# Patient Record
Sex: Female | Born: 1960 | Hispanic: No | Marital: Married | Smoking: Never smoker
Health system: Southern US, Community
[De-identification: ages and names within clinical notes are randomized; demographics above are authoritative.]

## PROBLEM LIST (undated history)

## (undated) DIAGNOSIS — K8020 Calculus of gallbladder without cholecystitis without obstruction: Secondary | ICD-10-CM

## (undated) DIAGNOSIS — Z8669 Personal history of other diseases of the nervous system and sense organs: Secondary | ICD-10-CM

## (undated) DIAGNOSIS — J302 Other seasonal allergic rhinitis: Secondary | ICD-10-CM

## (undated) DIAGNOSIS — E538 Deficiency of other specified B group vitamins: Secondary | ICD-10-CM

## (undated) DIAGNOSIS — M47817 Spondylosis without myelopathy or radiculopathy, lumbosacral region: Secondary | ICD-10-CM

## (undated) DIAGNOSIS — K7581 Nonalcoholic steatohepatitis (NASH): Secondary | ICD-10-CM

## (undated) DIAGNOSIS — K219 Gastro-esophageal reflux disease without esophagitis: Secondary | ICD-10-CM

## (undated) DIAGNOSIS — A630 Anogenital (venereal) warts: Secondary | ICD-10-CM

## (undated) DIAGNOSIS — M5137 Other intervertebral disc degeneration, lumbosacral region: Secondary | ICD-10-CM

## (undated) DIAGNOSIS — K811 Chronic cholecystitis: Secondary | ICD-10-CM

## (undated) DIAGNOSIS — F4321 Adjustment disorder with depressed mood: Secondary | ICD-10-CM

## (undated) DIAGNOSIS — M4180 Other forms of scoliosis, site unspecified: Secondary | ICD-10-CM

## (undated) HISTORY — DX: Spondylosis without myelopathy or radiculopathy, lumbosacral region: M47.817

## (undated) HISTORY — DX: Other forms of scoliosis, site unspecified: M41.80

## (undated) HISTORY — PX: BUNIONECTOMY: SHX129

## (undated) HISTORY — DX: Other intervertebral disc degeneration, lumbosacral region: M51.37

## (undated) HISTORY — DX: Other seasonal allergic rhinitis: J30.2

## (undated) HISTORY — DX: Personal history of other diseases of the nervous system and sense organs: Z86.69

## (undated) HISTORY — DX: Chronic cholecystitis: K81.1

## (undated) HISTORY — DX: Deficiency of other specified B group vitamins: E53.8

## (undated) HISTORY — DX: Adjustment disorder with depressed mood: F43.21

## (undated) HISTORY — DX: Nonalcoholic steatohepatitis (NASH): K75.81

## (undated) HISTORY — DX: Anogenital (venereal) warts: A63.0

## (undated) HISTORY — DX: Gastro-esophageal reflux disease without esophagitis: K21.9

---

## 2010-04-08 DIAGNOSIS — N644 Mastodynia: Secondary | ICD-10-CM

## 2010-04-08 DIAGNOSIS — R079 Chest pain, unspecified: Secondary | ICD-10-CM

## 2010-04-09 ENCOUNTER — Ambulatory Visit: Payer: Self-pay | Admitting: Cardiovascular Disease

## 2010-04-09 ENCOUNTER — Encounter: Payer: Self-pay | Admitting: Cardiovascular Disease

## 2010-04-17 ENCOUNTER — Encounter: Admission: RE | Admit: 2010-04-17 | Discharge: 2010-04-17 | Payer: Self-pay | Admitting: Family Medicine

## 2010-07-09 NOTE — Assessment & Plan Note (Signed)
Summary: NP6/ CHESTPAIN X 4-6 WEEKS. PT IS SELF PAY./ GD   Visit Type:  Initial Consult Primary Provider:  Dr Shanda Bumps Copland  CC:  chest pain.  History of Present Illness: 50 yo Hispanic female with no significant past medical history who is here today as a new patient for further evaluation of left sided chest pain. She was seen in the [facility] by Dr. Abbe Amsterdam on April 05, 2010. EKG normal at that time. She tells me today that she has had a pulling type pain in her left breast, sometimes around lateral left chest wall. This is sometimes reproduced by pressing her chest wall. The pain lasts for 2 seconds to 2 hours. There is no associated SOB, diaphoresis, nausea or vomiting. The pain occurs 1-2 hours after meals. She denies pre-syncope or syncope. The pain has been relieved with Prevacid. She feels a burning in her esophagus just before the onset of the pain.   Preventive Screening-Counseling & Management  Alcohol-Tobacco     Smoking Status: never  Caffeine-Diet-Exercise     Does Patient Exercise: no      Drug Use:  no.    Current Medications (verified): 1)  None  Allergies (verified): No Known Drug Allergies  Past History:  Past Medical History: Gallstones    Past Surgical History: Bilateral foot surgery  Family History: Reviewed history and no changes required. 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  Alcohol Use - no Regular Exercise - no Drug Use - no Originally from [place] Smoking Status:  never Does Patient Exercise:  no Drug Use:  no  Review of Systems       The patient complains of chest pain.  The patient denies fatigue, malaise, fever, weight gain/loss, vision loss, decreased hearing, hoarseness, palpitations, shortness of breath, prolonged cough, wheezing, sleep apnea, coughing up  blood, abdominal pain, blood in stool, nausea, vomiting, diarrhea, heartburn, incontinence, blood in urine, muscle weakness, joint pain, leg swelling, rash, skin lesions, headache, fainting, dizziness, depression, anxiety, enlarged lymph nodes, easy bruising or bleeding, and environmental allergies.    Vital Signs:  Patient profile:   50 year old female Height:      62 inches Weight:      153 pounds BMI:     28.09 Pulse rate:   69 / minute BP sitting:   98 / 60  (left arm) Cuff size:   regular  Vitals Entered By: Hardin Negus, RMA (April 09, 2010 10:27 AM)  Physical Exam  General:  General: Well developed, well nourished, NAD HEENT: OP clear, mucus membranes moist SKIN: warm, dry Neuro: No focal deficits Musculoskeletal: Muscle strength 5/5 all ext Psychiatric: Mood and affect normal Neck: No JVD, no carotid bruits, no thyromegaly, no lymphadenopathy. Lungs:Clear bilaterally, no wheezes, rhonci, crackles CV: RRR no murmurs, gallops rubs Abdomen: soft, NT, ND, BS present Extremities: No edema, pulses 2+.    EKG  Procedure date:  04/09/2010  Findings:      NSR, rate 69 bpm. Premature atrial contraction. No ischemci changes.   Impression & Recommendations:  Problem # 1:  CHEST PAIN (ICD-786.50) Her pain is atypical. Her only risk factor for CAD  is her family history . She does not smoke and has no history of HTN, hyperlipidemia or Diabetes. She is active and not overweight. The pain occurs 1-2 hours after meals and is relieved  with Prevacid.  I have discussed the low likelihood of CAD with the patient and offered an exercise treadmill stress test to rule out ischemia. She is a self pay without insurance and chooses not to pursue the stress test at this time. She will call us if she has any change in her clinical status.   Orders: EKG w/ Interpretation (93000)  Patient Instructions: 1)  Your physician recommends that you schedule a follow-up appointment as needed. 2)   Your physician recommends that you continue on your current medications as directed. Please refer to the Current Medication list given to you today.

## 2012-10-07 ENCOUNTER — Other Ambulatory Visit: Payer: Self-pay | Admitting: Gastroenterology

## 2012-10-07 DIAGNOSIS — R1013 Epigastric pain: Secondary | ICD-10-CM

## 2012-10-13 ENCOUNTER — Other Ambulatory Visit: Payer: Self-pay

## 2012-10-15 ENCOUNTER — Ambulatory Visit
Admission: RE | Admit: 2012-10-15 | Discharge: 2012-10-15 | Disposition: A | Discharge status: Home / Self Care | Payer: BC Managed Care – PPO | Source: Ambulatory Visit | Attending: Gastroenterology | Admitting: Gastroenterology

## 2012-10-15 DIAGNOSIS — R1013 Epigastric pain: Secondary | ICD-10-CM

## 2012-10-15 MED ORDER — IOHEXOL 300 MG/ML  SOLN
100.0000 mL | Freq: Once | INTRAMUSCULAR | Status: AC | PRN
  Administered 2012-10-15: 100 mL via INTRAVENOUS

## 2012-10-22 ENCOUNTER — Other Ambulatory Visit: Payer: Self-pay

## 2012-10-22 DIAGNOSIS — Z1231 Encounter for screening mammogram for malignant neoplasm of breast: Secondary | ICD-10-CM

## 2012-10-26 ENCOUNTER — Encounter (INDEPENDENT_AMBULATORY_CARE_PROVIDER_SITE_OTHER): Payer: Self-pay | Admitting: General Surgery

## 2012-10-26 ENCOUNTER — Ambulatory Visit (INDEPENDENT_AMBULATORY_CARE_PROVIDER_SITE_OTHER): Payer: BC Managed Care – PPO | Admitting: General Surgery

## 2012-10-26 DIAGNOSIS — A630 Anogenital (venereal) warts: Secondary | ICD-10-CM

## 2012-10-26 NOTE — Progress Notes (Signed)
Patient ID: Chelsey Hinton, female   DOB: [birth year], 52 y.o.   MRN: [id number]  No chief complaint on file.   HPI Chelsey Hinton is a 52 y.o. female.   HPI  She is referred by Dr. 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 She has known about this irregular area for about 6 months. She states sometimes it itches. Dr. Loreta Ave performed a colonoscopy on her and she noted it at that time.  She denies vaginal warts.  Past Medical History  Diagnosis Date  . History of migraine headaches   . GERD (gastroesophageal reflux disease)     Past Surgical History  Procedure Laterality Date  . Bunionectomy      History reviewed. No pertinent family history.  Social History History  Substance Use Topics  . Smoking status: Not on file  . Smokeless tobacco: Not on file  . Alcohol Use: Not on file    Not on File  No current outpatient prescriptions on file.   No current facility-administered medications for this visit.    Review of Systems Review of Systems  Constitutional: Negative.   Gastrointestinal: Positive for constipation.    There were no vitals taken for this visit.  Physical Exam Physical Exam  Constitutional: She appears well-developed and well-nourished. No distress.  HENT:  Head: Normocephalic and atraumatic.  Genitourinary:  Irregular skin lesion suspicious for condyloma at the 9:00 position. No fissures. On digital rectal exam no masses are noted.  After discussion with her, the area was treated with Podophyllin.    Data Reviewed Dr. Kenna Gilbert notes.  Assessment    Perianal condyloma treated with chemical ablation.     Plan    Return visit in 3 weeks to reevaluate success of treatment. If this is unsuccessful, may need to excise the area and send to pathology.        Zemirah Krasinski J 10/26/2012, 4:28 PM

## 2012-10-26 NOTE — Patient Instructions (Signed)
Wash area off in 4 hours.

## 2012-11-03 ENCOUNTER — Encounter (INDEPENDENT_AMBULATORY_CARE_PROVIDER_SITE_OTHER): Payer: Self-pay

## 2012-11-09 ENCOUNTER — Ambulatory Visit
Admission: RE | Admit: 2012-11-09 | Discharge: 2012-11-09 | Disposition: A | Discharge status: Home / Self Care | Payer: BC Managed Care – PPO | Source: Ambulatory Visit

## 2012-11-09 DIAGNOSIS — Z1231 Encounter for screening mammogram for malignant neoplasm of breast: Secondary | ICD-10-CM

## 2012-11-17 ENCOUNTER — Ambulatory Visit (INDEPENDENT_AMBULATORY_CARE_PROVIDER_SITE_OTHER): Payer: BC Managed Care – PPO | Admitting: General Surgery

## 2012-11-17 ENCOUNTER — Encounter (INDEPENDENT_AMBULATORY_CARE_PROVIDER_SITE_OTHER): Payer: Self-pay | Admitting: General Surgery

## 2012-11-17 VITALS — BP 110/60 | HR 76 | Temp 97.7°F | Resp 16 | Ht 62.0 in | Wt 154.2 lb

## 2012-11-17 DIAGNOSIS — A630 Anogenital (venereal) warts: Secondary | ICD-10-CM

## 2012-11-17 NOTE — Progress Notes (Signed)
Subjective:     Patient ID: Chelsey Hinton, female   DOB: 1961-04-22, 52 y.o.   MRN: [id number]  HPI  She is here for a followup visit after chemical ablation of a perianal condylomatous type lesion at the 9:00 position.   Review of Systems  No itching or bleeding.     Objective:   Physical Exam Generally she looks well and is in no acute distress.  Anorectal-the condylomatous lesion at the 9:00 position and the perianal skin persists.    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     Plan:     Excision and fulguration under local anesthesia outpatient surgical Center. The procedure, rationale, and risks were explained to her. She seems to understand all these and agrees to proceed.

## 2012-11-17 NOTE — Patient Instructions (Signed)
We will schedule your surgery today. 

## 2012-12-03 ENCOUNTER — Other Ambulatory Visit (INDEPENDENT_AMBULATORY_CARE_PROVIDER_SITE_OTHER): Payer: Self-pay | Admitting: General Surgery

## 2012-12-05 DIAGNOSIS — A630 Anogenital (venereal) warts: Secondary | ICD-10-CM

## 2012-12-24 ENCOUNTER — Ambulatory Visit (INDEPENDENT_AMBULATORY_CARE_PROVIDER_SITE_OTHER): Payer: BC Managed Care – PPO | Admitting: General Surgery

## 2012-12-24 ENCOUNTER — Encounter (INDEPENDENT_AMBULATORY_CARE_PROVIDER_SITE_OTHER): Payer: Self-pay | Admitting: General Surgery

## 2012-12-24 VITALS — BP 92/60 | HR 68 | Resp 16 | Ht 62.0 in | Wt 155.0 lb

## 2012-12-24 DIAGNOSIS — Z9889 Other specified postprocedural states: Secondary | ICD-10-CM

## 2012-12-24 NOTE — Patient Instructions (Signed)
Examining yourself once a month like we discussed.

## 2012-12-24 NOTE — Progress Notes (Signed)
Procedure:  Excision and fulguration of anal condyloma  Date:  12/03/12  Pathology:  Condyloma acuminatum  History:  She is here for her first post op visit  Exam: General- Is in NAD. Anorectal-Small scar in the 9:00 region without drainage.  Assessment:  Status post excision and fulguration of condyloma acuminatum. Pathology was explained to her. I taught her how to do a self-examination.  Plan:  Return visit for reexamination in 6 months.

## 2013-03-17 ENCOUNTER — Encounter (INDEPENDENT_AMBULATORY_CARE_PROVIDER_SITE_OTHER): Payer: Self-pay | Admitting: General Surgery

## 2013-03-17 ENCOUNTER — Ambulatory Visit (INDEPENDENT_AMBULATORY_CARE_PROVIDER_SITE_OTHER): Payer: BC Managed Care – PPO | Admitting: General Surgery

## 2013-03-17 ENCOUNTER — Encounter (HOSPITAL_COMMUNITY): Payer: Self-pay | Admitting: *Deleted

## 2013-03-17 ENCOUNTER — Emergency Department (HOSPITAL_BASED_OUTPATIENT_CLINIC_OR_DEPARTMENT_OTHER): Payer: BC Managed Care – PPO

## 2013-03-17 ENCOUNTER — Encounter (HOSPITAL_BASED_OUTPATIENT_CLINIC_OR_DEPARTMENT_OTHER): Payer: Self-pay | Admitting: Emergency Medicine

## 2013-03-17 ENCOUNTER — Emergency Department (HOSPITAL_BASED_OUTPATIENT_CLINIC_OR_DEPARTMENT_OTHER)
Admission: EM | Admit: 2013-03-17 | Discharge: 2013-03-17 | Disposition: A | Discharge status: Home / Self Care | Payer: BC Managed Care – PPO | Attending: Emergency Medicine | Admitting: Emergency Medicine

## 2013-03-17 ENCOUNTER — Inpatient Hospital Stay (HOSPITAL_COMMUNITY)
Admission: AD | Admit: 2013-03-17 | Discharge: 2013-03-22 | DRG: 494 | Disposition: A | Discharge status: Home / Self Care | Payer: BC Managed Care – PPO | Source: Ambulatory Visit | Attending: General Surgery | Admitting: General Surgery

## 2013-03-17 ENCOUNTER — Other Ambulatory Visit: Payer: Self-pay

## 2013-03-17 VITALS — BP 102/70 | HR 74 | Temp 97.6°F | Resp 16 | Ht 62.0 in | Wt 152.4 lb

## 2013-03-17 DIAGNOSIS — K219 Gastro-esophageal reflux disease without esophagitis: Secondary | ICD-10-CM | POA: Diagnosis present

## 2013-03-17 DIAGNOSIS — R748 Abnormal levels of other serum enzymes: Secondary | ICD-10-CM | POA: Diagnosis present

## 2013-03-17 DIAGNOSIS — Z8679 Personal history of other diseases of the circulatory system: Secondary | ICD-10-CM | POA: Insufficient documentation

## 2013-03-17 DIAGNOSIS — K8010 Calculus of gallbladder with chronic cholecystitis without obstruction: Principal | ICD-10-CM | POA: Diagnosis present

## 2013-03-17 DIAGNOSIS — Z79899 Other long term (current) drug therapy: Secondary | ICD-10-CM

## 2013-03-17 DIAGNOSIS — K7581 Nonalcoholic steatohepatitis (NASH): Secondary | ICD-10-CM | POA: Diagnosis present

## 2013-03-17 DIAGNOSIS — K829 Disease of gallbladder, unspecified: Secondary | ICD-10-CM

## 2013-03-17 DIAGNOSIS — K811 Chronic cholecystitis: Secondary | ICD-10-CM | POA: Diagnosis present

## 2013-03-17 DIAGNOSIS — K810 Acute cholecystitis: Secondary | ICD-10-CM

## 2013-03-17 DIAGNOSIS — K8020 Calculus of gallbladder without cholecystitis without obstruction: Secondary | ICD-10-CM | POA: Diagnosis present

## 2013-03-17 DIAGNOSIS — R112 Nausea with vomiting, unspecified: Secondary | ICD-10-CM | POA: Insufficient documentation

## 2013-03-17 DIAGNOSIS — G43909 Migraine, unspecified, not intractable, without status migrainosus: Secondary | ICD-10-CM | POA: Diagnosis present

## 2013-03-17 DIAGNOSIS — K7689 Other specified diseases of liver: Secondary | ICD-10-CM | POA: Diagnosis present

## 2013-03-17 DIAGNOSIS — Z23 Encounter for immunization: Secondary | ICD-10-CM

## 2013-03-17 HISTORY — DX: Calculus of gallbladder without cholecystitis without obstruction: K80.20

## 2013-03-17 LAB — COMPREHENSIVE METABOLIC PANEL
Albumin: 4 g/dL (ref 3.5–5.2)
Alkaline Phosphatase: 89 U/L (ref 39–117)
BUN: 16 mg/dL (ref 6–23)
CO2: 28 mEq/L (ref 19–32)
Chloride: 102 mEq/L (ref 96–112)
Creatinine, Ser: 0.6 mg/dL (ref 0.50–1.10)
GFR calc non Af Amer: >90 mL/min (ref >90)
Potassium: 4.2 mEq/L (ref 3.5–5.1)
Total Bilirubin: 0.9 mg/dL (ref 0.3–1.2)

## 2013-03-17 LAB — CBC WITH DIFFERENTIAL/PLATELET
HCT: 41.5 % (ref 36.0–46.0)
Hemoglobin: 13.8 g/dL (ref 12.0–15.0)
Lymphocytes Relative: 11 % — ABNORMAL LOW (ref 12–46)
Lymphs Abs: 1 10*3/uL (ref 0.7–4.0)
Monocytes Absolute: 0.4 10*3/uL (ref 0.1–1.0)
Monocytes Relative: 4 % (ref 3–12)
Neutro Abs: 7.5 10*3/uL (ref 1.7–7.7)
Neutrophils Relative %: 84 % — ABNORMAL HIGH (ref 43–77)
RBC: 4.74 MIL/uL (ref 3.87–5.11)
WBC: 8.9 10*3/uL (ref 4.0–10.5)

## 2013-03-17 LAB — TROPONIN I: Troponin I: <0.3 ng/mL (ref <0.30)

## 2013-03-17 LAB — LIPASE, BLOOD: Lipase: 34 U/L (ref 11–59)

## 2013-03-17 MED ORDER — ONDANSETRON HCL 4 MG/2ML IJ SOLN
4.0000 mg | Freq: Once | INTRAMUSCULAR | Status: AC
  Administered 2013-03-17: 4 mg via INTRAVENOUS
  Filled 2013-03-17: qty 2

## 2013-03-17 MED ORDER — HYDROCODONE-ACETAMINOPHEN 5-325 MG PO TABS: 2.0000 | ORAL_TABLET | ORAL | Status: DC | PRN

## 2013-03-17 MED ORDER — HYDROMORPHONE HCL PF 1 MG/ML IJ SOLN
1.0000 mg | Freq: Once | INTRAMUSCULAR | Status: DC
  Filled 2013-03-17: qty 1

## 2013-03-17 MED ORDER — ONDANSETRON HCL 4 MG/2ML IJ SOLN: 4.0000 mg | Freq: Four times a day (QID) | INTRAMUSCULAR | Status: DC | PRN

## 2013-03-17 MED ORDER — KCL IN DEXTROSE-NACL 20-5-0.9 MEQ/L-%-% IV SOLN
INTRAVENOUS | Status: DC
  Administered 2013-03-17 – 2013-03-18 (×3): via INTRAVENOUS
  Administered 2013-03-19: 125 mL via INTRAVENOUS
  Administered 2013-03-19: 03:00:00 via INTRAVENOUS
  Administered 2013-03-20: 125 mL/h via INTRAVENOUS
  Administered 2013-03-20 (×2): via INTRAVENOUS
  Filled 2013-03-17 (×13): qty 1000

## 2013-03-17 MED ORDER — INFLUENZA VAC SPLIT QUAD 0.5 ML IM SUSP
0.5000 mL | INTRAMUSCULAR | Status: AC
  Filled 2013-03-17 (×3): qty 0.5

## 2013-03-17 MED ORDER — MORPHINE SULFATE 2 MG/ML IJ SOLN
1.0000 mg | INTRAMUSCULAR | Status: DC | PRN
  Administered 2013-03-18: 2 mg via INTRAVENOUS
  Filled 2013-03-17: qty 1

## 2013-03-17 MED ORDER — SODIUM CHLORIDE 0.9 % IV SOLN
3.0000 g | Freq: Four times a day (QID) | INTRAVENOUS | Status: DC
  Administered 2013-03-17 – 2013-03-21 (×16): 3 g via INTRAVENOUS
  Filled 2013-03-17 (×18): qty 3

## 2013-03-17 MED ORDER — ONDANSETRON HCL 4 MG/2ML IJ SOLN: 4.0000 mg | Freq: Once | INTRAMUSCULAR | Status: DC

## 2013-03-17 MED ORDER — ONDANSETRON HCL 4 MG PO TABS: 4.0000 mg | ORAL_TABLET | Freq: Four times a day (QID) | ORAL | Status: DC

## 2013-03-17 MED ORDER — HYDROMORPHONE HCL PF 1 MG/ML IJ SOLN
1.0000 mg | Freq: Once | INTRAMUSCULAR | Status: AC
  Administered 2013-03-17: 1 mg via INTRAVENOUS

## 2013-03-17 NOTE — Progress Notes (Signed)
Patient ID: Chelsey Hinton, female   DOB: [birth year], 52 y.o.   MRN: [id number]  Chief Complaint  Patient presents with  . Abdominal Pain    Gallstones    HPI Chelsey Hinton is a 52 y.o. female.  Referred by Dr. Loreta Ave for evaluation ofsymptomatic cholelithiasis. She was in her usual state of health until about 3:00 this morning when she was awoken from her sleep with severe epigastric abdominal pain which she feels as if something is "tightening down". She has no radiation of the pain. She tried to take some Pepto-Bismol and did not get any relief with this and she began vomiting and she went to the emergency room for evaluation. She had an ultrasound which demonstrated multiple gallstones and a mildly thickened wall of the airway but the count was normal and her transaminases were only slightly elevated and she was discharged. She does have some chills but no fevers and she says that her bowels are normal without any blood in the stools. She has been taking PPIs for a history of reflux and she has also had upper and lower endoscopy about 4 months ago which were normal by the patient reports except for some small polyps. She says that she hasn't been able to keep even anything down even liquids since she was in the emergency room. HPI  Past Medical History  Diagnosis Date  . History of migraine headaches   . GERD (gastroesophageal reflux disease)   . Gallstones 2014    Past Surgical History  Procedure Laterality Date  . Bunionectomy      Family History  Problem Relation Age of Onset  . Heart disease Father     Social History History  Substance Use Topics  . Smoking status: Never Smoker   . Smokeless tobacco: Never Used  . Alcohol Use: No    No Known Allergies  Current Outpatient Prescriptions  Medication Sig Dispense Refill  . bismuth subsalicylate (PEPTO BISMOL) 262 MG chewable tablet Chew 524 mg by mouth as needed for indigestion.      Marland Kitchen Dexlansoprazole (DEXILANT) 30 MG capsule  Take 30 mg by mouth daily.      . fexofenadine (ALLEGRA) 30 MG tablet Take 30 mg by mouth 2 (two) times daily.      Marland Kitchen HYDROcodone-acetaminophen (NORCO/VICODIN) 5-325 MG per tablet Take 2 tablets by mouth every 4 (four) hours as needed for pain.  10 tablet  0  . ondansetron (ZOFRAN) 4 MG tablet Take 1 tablet (4 mg total) by mouth every 6 (six) hours.  12 tablet  0   No current facility-administered medications for this visit.    Review of Systems Review of Systems All other review of systems negative or noncontributory except as stated in the HPI  Blood pressure 102/70, pulse 74, temperature 97.6 F (36.4 C), temperature source Temporal, resp. rate 16, height 5\' 2"  (1.575 m), weight 152 lb 6.4 oz (69.128 kg).  Physical Exam Physical Exam Physical Exam  Nursing note and vitals reviewed. Constitutional: She is oriented to person, place, and time. She appears well-developed and well-nourished. No distress.  HENT:  Head: Normocephalic and atraumatic.  Mouth/Throat: No oropharyngeal exudate.  Eyes: Conjunctivae and EOM are normal. Pupils are equal, round, and reactive to light. Right eye exhibits no discharge. Left eye exhibits no discharge. No scleral icterus.  Neck: Normal range of motion. Neck supple. No tracheal deviation present.  Cardiovascular: Normal rate, regular rhythm, normal heart sounds and intact distal pulses.   Pulmonary/Chest:  Effort normal and breath sounds normal. No stridor. No respiratory distress. She has no wheezes.  Abdominal: Soft. Bowel sounds are normal. She exhibits no distension and no mass. She is very tender in the RUQ and epigastric area. There is no rebound and no guarding.  Musculoskeletal: Normal range of motion. She exhibits no edema and no tenderness.  Neurological: She is alert and oriented to person, place, and time.  Skin: Skin is warm and dry. No rash noted. She is not diaphoretic. No erythema. No pallor.  Psychiatric: She has a normal mood and  affect. Her behavior is normal. Judgment and thought content normal.    Data Reviewed [place] and labs  Assessment    Cholelithiasis and likely acute cholecystitis Given the amount of pain that she has and the duration and persistence of her pain as well as her mildly elevated transaminases an ultrasound of I'm concerned that she has acute cholecystitis. She is unable to keep down even liquids and I have recommended direct admission to the hospital for for IV fluids and antibiotics and cholecystectomy as soon as available. We will plan for direct admission from a cholecystectomy likely in the morning by Dr. Gerrit Friends. I discussed her case with him and he will plan on cholecystectomy for tomorrow if time permits.    Plan    We will plan for antibiotics and direct admission to the hospital and plan for cholecystectomy as soon as available        Lodema Pilot DAVID 03/17/2013, 5:25 PM

## 2013-03-17 NOTE — ED Provider Notes (Signed)
CSN: [id number]     Arrival date & time 03/17/13  4098 History   First MD Initiated Contact with Patient 03/17/13 1210     Chief Complaint  Patient presents with  . Abdominal Pain  . Vomiting   (Consider location/radiation/quality/duration/timing/severity/associated sxs/prior Treatment) Patient is a 52 y.o. female presenting with abdominal pain. The history is provided by the patient. No language interpreter was used.  Abdominal Pain Pain quality: aching and sharp   Pain radiates to:  Does not radiate Pain severity:  Moderate Onset quality:  Gradual Duration:  1 day Timing:  Constant Progression:  Worsening Chronicity:  New Context: eating   Relieved by:  Nothing Worsened by:  Nothing tried Ineffective treatments:  None tried Associated symptoms: nausea and vomiting     Past Medical History  Diagnosis Date  . History of migraine headaches   . GERD (gastroesophageal reflux disease)    Past Surgical History  Procedure Laterality Date  . Bunionectomy     Family History  Problem Relation Age of Onset  . Heart disease Father    History  Substance Use Topics  . Smoking status: Never Smoker   . Smokeless tobacco: Never Used  . Alcohol Use: No   OB History   Grav Para Term Preterm Abortions TAB SAB Ect Mult Living                 Review of Systems  Gastrointestinal: Positive for nausea, vomiting and abdominal pain.  All other systems reviewed and are negative.    Allergies  Review of patient's allergies indicates no known allergies.  Home Medications   Current Outpatient Rx  Name  Route  Sig  Dispense  Refill  . bismuth subsalicylate (PEPTO BISMOL) 262 MG chewable tablet   Oral   Chew 524 mg by mouth as needed for indigestion.         Marland Kitchen Dexlansoprazole (DEXILANT) 30 MG capsule   Oral   Take 30 mg by mouth daily.         . fexofenadine (ALLEGRA) 30 MG tablet   Oral   Take 30 mg by mouth 2 (two) times daily.          BP 111/85  Pulse 54   Temp(Src) 98.4 F (36.9 C) (Oral)  Resp 18  SpO2 100% Physical Exam  Nursing note and vitals reviewed. HENT:  Head: Normocephalic.  Right Ear: External ear normal.  Left Ear: External ear normal.  Eyes: Conjunctivae are normal. Pupils are equal, round, and reactive to light.  Neck: Normal range of motion.  Cardiovascular: Normal rate and regular rhythm.   Pulmonary/Chest: Effort normal and breath sounds normal.  Abdominal: Soft. There is tenderness.  Musculoskeletal: Normal range of motion.  Neurological: She is alert.  Skin: Skin is warm.  Psychiatric: She has a normal mood and affect.    ED Course  Procedures (including critical care time) Labs Review Labs Reviewed  CBC WITH DIFFERENTIAL - Abnormal; Notable for the following:    Neutrophils Relative % 84 (*)    Lymphocytes Relative 11 (*)    All other components within normal limits  COMPREHENSIVE METABOLIC PANEL - Abnormal; Notable for the following:    Glucose, Bld 144 (*)    Total Protein 8.6 (*)    AST 54 (*)    ALT 38 (*)    All other components within normal limits  TROPONIN I  LIPASE, BLOOD   Imaging Review No results found.  EKG Interpretation   None  MDM   1. Gallbladder disease     Ultrasound shows gallstones. Labs no wbc elevation,   Lipase is normal.  Pt reports she has an appointment with Dr. Loreta Ave that she wants to make today.  Pt reports some relief with pain medication.   I spoke to radiologist who reports no obvious choloecystitis.   Pt referred to [facility].   Pt given rx for hydrocodone and zofran,   I pain worsens or changes she is advised [place] to [facility] or [facility] ED.    Lonia Skinner Diablo, PA-C 03/17/13 1443

## 2013-03-17 NOTE — ED Notes (Signed)
Pt states that this AM she began have severe mid-epigastric pain with nausea and vomiting.

## 2013-03-18 ENCOUNTER — Encounter (HOSPITAL_COMMUNITY): Admission: AD | Disposition: A | Discharge status: Home / Self Care | Payer: Self-pay | Source: Ambulatory Visit

## 2013-03-18 ENCOUNTER — Encounter (HOSPITAL_COMMUNITY): Payer: Self-pay

## 2013-03-18 DIAGNOSIS — K8020 Calculus of gallbladder without cholecystitis without obstruction: Secondary | ICD-10-CM

## 2013-03-18 DIAGNOSIS — R1011 Right upper quadrant pain: Secondary | ICD-10-CM

## 2013-03-18 LAB — CBC
HCT: 36.7 % (ref 36.0–46.0)
Hemoglobin: 12 g/dL (ref 12.0–15.0)
MCH: 28.4 pg (ref 26.0–34.0)
MCHC: 32.7 g/dL (ref 30.0–36.0)
MCV: 86.8 fL (ref 78.0–100.0)
RDW: 13.8 % (ref 11.5–15.5)
WBC: 6.3 10*3/uL (ref 4.0–10.5)

## 2013-03-18 LAB — COMPREHENSIVE METABOLIC PANEL
AST: 1028 U/L — ABNORMAL HIGH (ref 0–37)
Alkaline Phosphatase: 167 U/L — ABNORMAL HIGH (ref 39–117)
BUN: 11 mg/dL (ref 6–23)
CO2: 27 mEq/L (ref 19–32)
Calcium: 9.1 mg/dL (ref 8.4–10.5)
Chloride: 105 mEq/L (ref 96–112)
Creatinine, Ser: 0.58 mg/dL (ref 0.50–1.10)
GFR calc Af Amer: >90 mL/min (ref >90)
GFR calc non Af Amer: >90 mL/min (ref >90)
Glucose, Bld: 129 mg/dL — ABNORMAL HIGH (ref 70–99)
Potassium: 4.6 mEq/L (ref 3.5–5.1)
Total Bilirubin: 1.8 mg/dL — ABNORMAL HIGH (ref 0.3–1.2)

## 2013-03-18 LAB — SURGICAL PCR SCREEN
MRSA, PCR: NEGATIVE
Staphylococcus aureus: NEGATIVE

## 2013-03-18 SURGERY — CANCELLED PROCEDURE

## 2013-03-18 NOTE — Care Management Note (Signed)
    Page 1 of 1   03/18/2013     11:42:10 AM   CARE MANAGEMENT NOTE 03/18/2013  Patient:  Chelsey Hinton, Chelsey Hinton   Account Number:  [id number]  Date Initiated:  03/18/2013  Documentation initiated by:  Lorenda Ishihara  Subjective/Objective Assessment:   52 yo female admitted with acute cholecystitis. PTA lived at home with children.     Action/Plan:   Home when stable   Anticipated DC Date:  03/18/2013   Anticipated DC Plan:  HOME/SELF CARE      DC Planning Services  CM consult      Choice offered to / List presented to:             Status of service:  Completed, signed off Medicare Important Message given?   (If response is "NO", the following Medicare IM given date fields will be blank) Date Medicare IM given:   Date Additional Medicare IM given:    Discharge Disposition:  HOME/SELF CARE  Per UR Regulation:  Reviewed for med. necessity/level of care/duration of stay  If discussed at Long Length of Stay Meetings, dates discussed:    Comments:

## 2013-03-18 NOTE — Progress Notes (Signed)
Subjective: Still sore RUQ, NO other complaints normally healthy lady.  Objective: Vital signs in last 24 hours: Temp:  [97.4 F (36.3 C)-98.5 F (36.9 C)] 98.2 F (36.8 C) (10/10 0610) Pulse Rate:  [54-93] 65 (10/10 0610) Resp:  [16-18] 16 (10/10 0610) BP: (96-111)/(55-85) 98/60 mmHg (10/10 0610) SpO2:  [95 %-100 %] 95 % (10/10 0610) Weight:  [69.128 kg (152 lb 6.4 oz)] 69.128 kg (152 lb 6.4 oz) (10/09 1931) Last BM Date: 03/17/13 Afebrile, VSS, BP down a little, I don't know her baseline. WBC OK LFT's way up.   I have called Dr. Loreta Hinton Intake/Output from previous day: 10/09 0701 - 10/10 0700 In: 1620.8 [I.V.:1320.8; IV Piggyback:300] Out: 700 [Urine:700] Intake/Output this shift:    General appearance: alert, cooperative and no distress GI: soft, tender RUQ, +BS, no distension.  Lab Results:   Recent Labs  03/17/13 1100 03/18/13 0420  WBC 8.9 6.3  HGB 13.8 12.0  HCT 41.5 36.7  PLT 286 294    BMET  Recent Labs  03/17/13 1100 03/18/13 0420  NA 140 141  K 4.2 4.6  CL 102 105  CO2 28 27  GLUCOSE 144* 129*  BUN 16 11  CREATININE 0.60 0.58  CALCIUM 10.1 9.1   PT/INR No results found for this basename: LABPROT, INR,  in the last 72 hours   Recent Labs Lab 03/17/13 1100 03/18/13 0420  AST 54* 1028*  ALT 38* 885*  ALKPHOS 89 167*  BILITOT 0.9 1.8*  PROT 8.6* 6.9  ALBUMIN 4.0 3.0*     Lipase     Component Value Date/Time   LIPASE 34 03/17/2013 1100     Studies/Results: US Abdomen Complete  03/17/2013   CLINICAL DATA:  Patient with severe epigastric pain.  EXAM: ULTRASOUND ABDOMEN COMPLETE  COMPARISON:  CT 10/15/2012  FINDINGS: Gallbladder  Multiple large mobile echogenic shadowing foci within the gallbladder lumen compatible with cholelithiasis. Minimal wall thickening measuring up to 4 mm. No definite pericholecystic fluid. Sonographer reports a positive sonographic Murphy's sign.  Common bile duct  Diameter: Measures 4 mm  Liver  No focal  lesion identified. Within normal limits in parenchymal echogenicity.  IVC  Not well-visualized.  Pancreas  Visualized portion unremarkable.  Spleen  Size and appearance within normal limits.  Right Kidney  Length: Measures 10.7 cm Echogenicity within normal limits. No mass or hydronephrosis visualized.  Left Kidney  Length: Measures 9.9 cm. Echogenicity within normal limits. No mass or hydronephrosis visualized.  Abdominal aorta  Not well-visualized.  IMPRESSION: Cholelithiasis with minimal gallbladder wall thickening and sonographer reported positive sonographic Murphy's sign. Combination of clinical and sonographic findings raise the possibility but are not definitive for acute cholecystitis. Consider further evaluation with HIDA scan as clinically indicated.  Discussed with Chelsey Masker, PA   Electronically Signed   By: Chelsey Hinton M.D.   On: 03/17/2013 13:12    Medications: . ampicillin-sulbactam (UNASYN) IV  3 g Intravenous Q6H  . influenza vac split quadrivalent PF  0.5 mL Intramuscular Tomorrow-1000   Prior to Admission medications   Medication Sig Start Date End Date Taking? Authorizing Provider  bismuth subsalicylate (PEPTO BISMOL) 262 MG chewable tablet Chew 524 mg by mouth as needed for indigestion.   Yes Historical Provider, MD  Dexlansoprazole (DEXILANT) 30 MG capsule Take 30 mg by mouth daily.   Yes Historical Provider, MD  fexofenadine (ALLEGRA) 30 MG tablet Take 30 mg by mouth 2 (two) times daily.   Yes Historical Provider, MD  lansoprazole (PREVACID)  30 MG capsule Take 30 mg by mouth daily.   Yes Historical Provider, MD  ondansetron (ZOFRAN) 4 MG tablet Take 1 tablet (4 mg total) by mouth every 6 (six) hours. 03/17/13   Elson Areas, PA-C     Assessment/Plan Cholelithiasis and likely acute cholecystitis History of migraine headaches  GERD (gastroesophageal reflux disease   Plan:  I have called Dr. Loreta Hinton to consider ERCP.  I discussed this with the patient and she understands.   She is NPO.   LOS: 1 day    Chelsey Hinton 03/18/2013

## 2013-03-18 NOTE — Progress Notes (Signed)
General Surgery [facility]  Patient seen and examined.  Agree with note by Zola Button.  USN with duplex studies ordered by Dr. Elnoria Howard - hopefully will be done this afternoon.  Hepatitis serologies ordered by Dr. Elnoria Howard.  Once studies completed and cleared by GI, will plan to proceed with lap chole with IOC.  Velora Heckler, MD, [facility] Office: [phone]

## 2013-03-18 NOTE — Consult Note (Signed)
Reason for Consult: Probable acute cholecystitis, Abnormal liver enzymes Referring Physician: CCS  Girtha Hake HPI: This is a 52 year old female with a PMH of GERD admitted for probable acute cholecystitis.  Her pain started acutely on 03/17/2013 and it was associated with nausea and vomiting.  The pain is located in the RUQ and lower chest.  She tried to self-medicate with Peptobismol, but she had nausea and vomiting x 3.  As a result of her symptoms she presented to the ER.  Her initially transaminases were very mild and in the 50's range, however, overnight, her transaminases spiked up into the 1000 range.  An ultrasound was performed before the enzyme elevations and it showed that the CBD was 4 mm.  No evidence of any other ductal dilation, but she was positive for a Murphy's sign.  There gallbladder wall was minimally thickened, but the over presentation was consistent with acute cholecystitis.  Currently she feels well at this time.  There is no history of any recent use of new medications and she denies any risk factors for viral hepatitis, i.e., IVDA or high risk sexual behavior.    Past Medical History  Diagnosis Date  . History of migraine headaches   . GERD (gastroesophageal reflux disease)   . Gallstones 2014    Past Surgical History  Procedure Laterality Date  . Bunionectomy      Family History  Problem Relation Age of Onset  . Heart disease Father     Social History:  reports that she has never smoked. She has never used smokeless tobacco. She reports that she does not drink alcohol or use illicit drugs.  Allergies: No Known Allergies  Medications:  Scheduled: . ampicillin-sulbactam (UNASYN) IV  3 g Intravenous Q6H  . influenza vac split quadrivalent PF  0.5 mL Intramuscular Tomorrow-1000   Continuous: . dextrose 5 % and 0.9 % NaCl with KCl 20 mEq/L 125 mL/hr at 03/18/13 0543    Results for orders placed during the hospital encounter of 03/17/13 (from the past 24  hour(s))  CBC     Status: None   Collection Time    03/18/13  4:20 AM      Result Value Range   WBC 6.3  4.0 - 10.5 K/uL   RBC 4.23  3.87 - 5.11 MIL/uL   Hemoglobin 12.0  12.0 - 15.0 g/dL   HCT 82.9  56.2 - 13.0 %   MCV 86.8  78.0 - 100.0 fL   MCH 28.4  26.0 - 34.0 pg   MCHC 32.7  30.0 - 36.0 g/dL   RDW 86.5  78.4 - 69.6 %   Platelets 294  150 - 400 K/uL  COMPREHENSIVE METABOLIC PANEL     Status: Abnormal   Collection Time    03/18/13  4:20 AM      Result Value Range   Sodium 141  135 - 145 mEq/L   Potassium 4.6  3.5 - 5.1 mEq/L   Chloride 105  96 - 112 mEq/L   CO2 27  19 - 32 mEq/L   Glucose, Bld 129 (*) 70 - 99 mg/dL   BUN 11  6 - 23 mg/dL   Creatinine, Ser 2.95  0.50 - 1.10 mg/dL   Calcium 9.1  8.4 - 28.4 mg/dL   Total Protein 6.9  6.0 - 8.3 g/dL   Albumin 3.0 (*) 3.5 - 5.2 g/dL   AST 1324 (*) 0 - 37 U/L   ALT 885 (*) 0 - 35  U/L   Alkaline Phosphatase 167 (*) 39 - 117 U/L   Total Bilirubin 1.8 (*) 0.3 - 1.2 mg/dL   GFR calc non Af Amer >90  >90 mL/min   GFR calc Af Amer >90  >90 mL/min  SURGICAL PCR SCREEN     Status: None   Collection Time    03/18/13  5:49 AM      Result Value Range   MRSA, PCR NEGATIVE  NEGATIVE   Staphylococcus aureus NEGATIVE  NEGATIVE     US Abdomen Complete  03/17/2013   CLINICAL DATA:  Patient with severe epigastric pain.  EXAM: ULTRASOUND ABDOMEN COMPLETE  COMPARISON:  CT 10/15/2012  FINDINGS: Gallbladder  Multiple large mobile echogenic shadowing foci within the gallbladder lumen compatible with cholelithiasis. Minimal wall thickening measuring up to 4 mm. No definite pericholecystic fluid. Sonographer reports a positive sonographic Murphy's sign.  Common bile duct  Diameter: Measures 4 mm  Liver  No focal lesion identified. Within normal limits in parenchymal echogenicity.  IVC  Not well-visualized.  Pancreas  Visualized portion unremarkable.  Spleen  Size and appearance within normal limits.  Right Kidney  Length: Measures 10.7 cm  Echogenicity within normal limits. No mass or hydronephrosis visualized.  Left Kidney  Length: Measures 9.9 cm. Echogenicity within normal limits. No mass or hydronephrosis visualized.  Abdominal aorta  Not well-visualized.  IMPRESSION: Cholelithiasis with minimal gallbladder wall thickening and sonographer reported positive sonographic Murphy's sign. Combination of clinical and sonographic findings raise the possibility but are not definitive for acute cholecystitis. Consider further evaluation with HIDA scan as clinically indicated.  Discussed with [name], PA   Electronically Signed   By: Annia Belt M.D.   On: 03/17/2013 13:12    ROS:  As stated above in the HPI otherwise negative.  Blood pressure 101/58, pulse 64, temperature 97.4 F (36.3 C), temperature source Oral, resp. rate 23, height 5\' 2"  (1.575 m), weight 152 lb 6.4 oz (69.128 kg), SpO2 98.00%.    PE: Gen: NAD, Alert and Oriented HEENT:  [place]/AT, EOMI Neck: Supple, no LAD Lungs: CTA Bilaterally CV: RRR without M/G/R ABM: Soft, NTND, +BS Ext: No C/C/E  Assessment/Plan: 1) Abnormal liver enzymes. 2) Cholelithiasis with probable acute cholecystitis.   The patient's liver enzymes markedly elevated, but her CBD was only 4 mm in size.  Also, her current enzyme pattern is not consistent with an obstruction.  I do not believe she requires an emergent ERCP or EUS with a normal duct diameter.  It is prudent to follow her liver enzymes and check an HBV and HCV serology.  Acute cholecystitis can cause enzyme elevations above 1000, but it is not an usual presentation.  Further work up will be required to exclude hepatocellular etiologies.  Plan: 1) Check HBV and HCV serologies. 2) Follow liver enzymes. 3) Recheck an ultrasound with doppler flow.  Budd-Chiari can cause the high elevations in the liver enzymes. 4) Continue with antibiotics. 5) If the work up is negative, I think it is safe to proceed with a lap chole.  If the IOC is  positive and ERCP can be pursued.   Rai Severns D 03/18/2013, 1:36 PM

## 2013-03-19 ENCOUNTER — Inpatient Hospital Stay (HOSPITAL_COMMUNITY): Payer: BC Managed Care – PPO

## 2013-03-19 ENCOUNTER — Encounter (HOSPITAL_COMMUNITY): Payer: Self-pay | Admitting: Surgery

## 2013-03-19 DIAGNOSIS — K219 Gastro-esophageal reflux disease without esophagitis: Secondary | ICD-10-CM | POA: Diagnosis present

## 2013-03-19 DIAGNOSIS — K8020 Calculus of gallbladder without cholecystitis without obstruction: Secondary | ICD-10-CM

## 2013-03-19 DIAGNOSIS — K810 Acute cholecystitis: Secondary | ICD-10-CM

## 2013-03-19 DIAGNOSIS — K7581 Nonalcoholic steatohepatitis (NASH): Secondary | ICD-10-CM | POA: Diagnosis present

## 2013-03-19 HISTORY — DX: Calculus of gallbladder without cholecystitis without obstruction: K80.20

## 2013-03-19 LAB — COMPREHENSIVE METABOLIC PANEL
ALT: 589 U/L — ABNORMAL HIGH (ref 0–35)
Albumin: 3 g/dL — ABNORMAL LOW (ref 3.5–5.2)
Alkaline Phosphatase: 176 U/L — ABNORMAL HIGH (ref 39–117)
BUN: 5 mg/dL — ABNORMAL LOW (ref 6–23)
CO2: 28 mEq/L (ref 19–32)
Calcium: 9.4 mg/dL (ref 8.4–10.5)
Chloride: 105 mEq/L (ref 96–112)
Creatinine, Ser: 0.56 mg/dL (ref 0.50–1.10)
GFR calc Af Amer: >90 mL/min (ref >90)
GFR calc non Af Amer: >90 mL/min (ref >90)
Glucose, Bld: 110 mg/dL — ABNORMAL HIGH (ref 70–99)
Sodium: 140 mEq/L (ref 135–145)

## 2013-03-19 LAB — CBC
HCT: 38.7 % (ref 36.0–46.0)
Hemoglobin: 12.4 g/dL (ref 12.0–15.0)
MCH: 28.4 pg (ref 26.0–34.0)
MCHC: 32 g/dL (ref 30.0–36.0)
MCV: 88.6 fL (ref 78.0–100.0)
RBC: 4.37 MIL/uL (ref 3.87–5.11)

## 2013-03-19 MED ORDER — ACETAMINOPHEN 650 MG RE SUPP
650.0000 mg | Freq: Four times a day (QID) | RECTAL | Status: DC | PRN
  Filled 2013-03-19: qty 1

## 2013-03-19 MED ORDER — ACETAMINOPHEN 500 MG PO TABS: 1000.0000 mg | ORAL_TABLET | Freq: Four times a day (QID) | ORAL | Status: DC | PRN

## 2013-03-19 MED ORDER — PROMETHAZINE HCL 25 MG/ML IJ SOLN: 12.5000 mg | Freq: Four times a day (QID) | INTRAMUSCULAR | Status: DC | PRN

## 2013-03-19 MED ORDER — ACETAMINOPHEN 325 MG PO TABS: 650.0000 mg | ORAL_TABLET | Freq: Four times a day (QID) | ORAL | Status: DC | PRN

## 2013-03-19 MED ORDER — DIPHENHYDRAMINE HCL 50 MG/ML IJ SOLN: 12.5000 mg | Freq: Four times a day (QID) | INTRAMUSCULAR | Status: DC | PRN

## 2013-03-19 NOTE — Progress Notes (Signed)
Subjective: **Still with abdominal pain but appears comfortable.  Objective: Vital signs in last 24 hours: Temp:  [97.9 F (36.6 C)-99.2 F (37.3 C)] 99.2 F (37.3 C) (10/11 0520) Pulse Rate:  [55-67] 67 (10/11 0520) Resp:  [16-18] 16 (10/11 0520) BP: (107)/(52-70) 107/70 mmHg (10/11 0520) SpO2:  [98 %-99 %] 98 % (10/11 0520) Last BM Date: 03/17/13 General:   Alert,  Well-developed, well-nourished, pleasant and cooperative in NAD Head:  Normocephalic and atraumatic. Eyes:  Sclera clear, no icterus.   Conjunctiva pink. Mouth:  No deformity or lesions, dentition normal. Neck:  Supple; no masses or thyromegaly. Heart:  Regular rate and rhythm; no murmurs, clicks, rubs,  or gallops. Abdomen:  Soft, nontender and nondistended. No masses, hepatosplenomegaly or hernias noted. Normal bowel sounds, without guarding, and without rebound.   Msk:  Symmetrical without gross deformities. Normal posture. Pulses:  Normal pulses noted. Extremities:  Without clubbing or edema. Neurologic:  Alert and  oriented x4;  grossly normal neurologically. Skin:  Intact without significant lesions or rashes. Cervical Nodes:  No significant cervical adenopathy. Psych:  Alert and cooperative. Normal mood and affect.  Intake/Output from previous day: 10/10 0701 - 10/11 0700 In: 3389.6 [I.V.:2989.6; IV Piggyback:400] Out: 1600 [Urine:1600] Intake/Output this shift: Total I/O In: 0  Out: 1000 [Urine:1000]  Lab Results:  Recent Labs  03/17/13 1100 03/18/13 0420 03/19/13 0530  WBC 8.9 6.3 5.6  HGB 13.8 12.0 12.4  HCT 41.5 36.7 38.7  PLT 286 294 287   BMET  Recent Labs  03/17/13 1100 03/18/13 0420 03/19/13 0530  NA 140 141 140  K 4.2 4.6 4.4  CL 102 105 105  CO2 28 27 28   GLUCOSE 144* 129* 110*  BUN 16 11 5*  CREATININE 0.60 0.58 0.56  CALCIUM 10.1 9.1 9.4   LFT  Recent Labs  03/19/13 0530  PROT 7.1  ALBUMIN 3.0*  AST 275*  ALT 589*  ALKPHOS 176*  BILITOT 2.0*   PT/INR No  results found for this basename: LABPROT, INR,  in the last 72 hours Hepatitis Panel No results found for this basename: HEPBSAG, HCVAB, HEPAIGM, HEPBIGM,  in the last 72 hours   Studies/Results: No results found.  Assessment: Abnormal LFTS - suspect biliary in origin although ultrasound was negative for ductal dilitation.  Hepatitis should be r/od .  Would expect elevated INR for hepatic vein thrombosis.**?? Mirizzi's Syndrome.*Plan repeat LFTS; await hepatitis serologies .  Check INR         Barbette Hair. Arlyce Dice, MD, [facility] [phone]   Melvia Heaps  03/19/2013, 3:22 PM

## 2013-03-19 NOTE — Progress Notes (Signed)
Spoke with Amber in Ultrasound x 3 this shift. She states ultrasound will be done this evening or patient"s [place] will be first done in am.

## 2013-03-19 NOTE — Progress Notes (Signed)
1 Day Post-Op  Subjective: Still with some RUQ pain but improved from initial presentation  Objective: Vital signs in last 24 hours: Temp:  [97.4 F (36.3 C)-99.2 F (37.3 C)] 99.2 F (37.3 C) (10/11 0520) Pulse Rate:  [55-67] 67 (10/11 0520) Resp:  [16-23] 16 (10/11 0520) BP: (101-116)/(52-72) 107/70 mmHg (10/11 0520) SpO2:  [98 %-99 %] 98 % (10/11 0520) Last BM Date: 03/17/13  Intake/Output from previous day: 10/10 0701 - 10/11 0700 In: 3389.6 [I.V.:2989.6; IV Piggyback:400] Out: 1600 [Urine:1600] Intake/Output this shift: Total I/O In: 0  Out: 1000 [Urine:1000]  General appearance: alert, cooperative and no distress Resp: clear to auscultation bilaterally Cardio: regular rate and rhythm, S1, S2 normal, no murmur, click, rub or gallop GI: soft, minimal RUQ tenderness, ND, no peritoneal signs  Lab Results:   Recent Labs  03/18/13 0420 03/19/13 0530  WBC 6.3 5.6  HGB 12.0 12.4  HCT 36.7 38.7  PLT 294 287   BMET  Recent Labs  03/18/13 0420 03/19/13 0530  NA 141 140  K 4.6 4.4  CL 105 105  CO2 27 28  GLUCOSE 129* 110*  BUN 11 5*  CREATININE 0.58 0.56  CALCIUM 9.1 9.4   PT/INR No results found for this basename: LABPROT, INR,  in the last 72 hours ABG No results found for this basename: PHART, PCO2, PO2, HCO3,  in the last 72 hours  Studies/Results: US Abdomen Complete  03/17/2013   CLINICAL DATA:  Patient with severe epigastric pain.  EXAM: ULTRASOUND ABDOMEN COMPLETE  COMPARISON:  CT 10/15/2012  FINDINGS: Gallbladder  Multiple large mobile echogenic shadowing foci within the gallbladder lumen compatible with cholelithiasis. Minimal wall thickening measuring up to 4 mm. No definite pericholecystic fluid. Sonographer reports a positive sonographic Murphy's sign.  Common bile duct  Diameter: Measures 4 mm  Liver  No focal lesion identified. Within normal limits in parenchymal echogenicity.  IVC  Not well-visualized.  Pancreas  Visualized portion  unremarkable.  Spleen  Size and appearance within normal limits.  Right Kidney  Length: Measures 10.7 cm Echogenicity within normal limits. No mass or hydronephrosis visualized.  Left Kidney  Length: Measures 9.9 cm. Echogenicity within normal limits. No mass or hydronephrosis visualized.  Abdominal aorta  Not well-visualized.  IMPRESSION: Cholelithiasis with minimal gallbladder wall thickening and sonographer reported positive sonographic Murphy's sign. Combination of clinical and sonographic findings raise the possibility but are not definitive for acute cholecystitis. Consider further evaluation with HIDA scan as clinically indicated.  Discussed with [name], PA   Electronically Signed   By: Annia Belt M.D.   On: 03/17/2013 13:12    Anti-infectives: Anti-infectives   Start     Dose/Rate Route Frequency Ordered Stop   03/17/13 1830  Ampicillin-Sulbactam (UNASYN) 3 g in sodium chloride 0.9 % 100 mL IVPB     3 g 100 mL/hr over 60 Minutes Intravenous 4 times per day 03/17/13 1815        Assessment/Plan: s/p Procedure(s): CANCELLED PROCEDURE awaiting hepatitis panel, [place],  and GI recommendations.  Surgery ready to do cholecystectomy if this is deemed to be from gallbladder vs. hepatitis vs. vascular event.  LOS: 2 days    Lodema Pilot DAVID 03/19/2013

## 2013-03-20 LAB — HEPATIC FUNCTION PANEL
ALT: 360 U/L — ABNORMAL HIGH (ref 0–35)
AST: 107 U/L — ABNORMAL HIGH (ref 0–37)
Albumin: 3 g/dL — ABNORMAL LOW (ref 3.5–5.2)
Alkaline Phosphatase: 150 U/L — ABNORMAL HIGH (ref 39–117)
Bilirubin, Direct: 0.7 mg/dL — ABNORMAL HIGH (ref 0.0–0.3)
Indirect Bilirubin: 0.7 mg/dL (ref 0.3–0.9)
Total Bilirubin: 1.4 mg/dL — ABNORMAL HIGH (ref 0.3–1.2)

## 2013-03-20 LAB — PROTIME-INR: Prothrombin Time: 12.8 seconds (ref 11.6–15.2)

## 2013-03-20 LAB — HEPATITIS PANEL, ACUTE: Hep A IgM: NEGATIVE

## 2013-03-20 NOTE — Progress Notes (Signed)
[facility] Progress Note  Subjective:  Feels ok.  Just anxious to get gallbladder out if that is the issue.  Objective:  Vital signs in last 24 hours: Temp:  [97.8 F (36.6 C)-98.4 F (36.9 C)] 98.4 F (36.9 C) (10/12 0541) Pulse Rate:  [52-67] 52 (10/12 0541) Resp:  [16-18] 16 (10/12 0541) BP: (94-121)/(48-74) 94/48 mmHg (10/12 0541) SpO2:  [94 %-96 %] 94 % (10/12 0541) Last BM Date: 03/17/13 General:   Alert, Well-developed, in NAD Heart:  Slightly bradycardic but regular rhythm; no murmurs Pulm:  CTAB.  No W/R/R. Abdomen:  Soft, non-distended.  BS present.  RUQ TTP without R/R/G. Extremities:  Without edema. Neurologic:  Alert and  oriented x4;  grossly normal neurologically. Psych:  Alert and cooperative. Normal mood and affect.  Intake/Output from previous day: 10/11 0701 - 10/12 0700 In: 3454 [P.O.:54; I.V.:3000; IV Piggyback:400] Out: 2875 [Urine:2875]  Lab Results:  Recent Labs  03/17/13 1100 03/18/13 0420 03/19/13 0530  WBC 8.9 6.3 5.6  HGB 13.8 12.0 12.4  HCT 41.5 36.7 38.7  PLT 286 294 287   BMET  Recent Labs  03/17/13 1100 03/18/13 0420 03/19/13 0530  NA 140 141 140  K 4.2 4.6 4.4  CL 102 105 105  CO2 28 27 28   GLUCOSE 144* 129* 110*  BUN 16 11 5*  CREATININE 0.60 0.58 0.56  CALCIUM 10.1 9.1 9.4   LFT  Recent Labs  03/20/13 0445  PROT 7.0  ALBUMIN 3.0*  AST 107*  ALT 360*  ALKPHOS 150*  BILITOT 1.4*  BILIDIR 0.7*  IBILI 0.7   PT/INR  Recent Labs  03/20/13 0445  LABPROT 12.8  INR 0.98   [place] Art/ven Flow Abd Pelv Doppler Limited  03/20/2013   CLINICAL DATA:  Abdominal pain.  Evaluate for Budd-Chiari syndrome.  EXAM: DUPLEX ULTRASOUND OF LIVER  TECHNIQUE: Color and duplex Doppler ultrasound was performed to evaluate the hepatic in-flow and out-flow vessels.  COMPARISON:  Ultrasound 03/17/2013  FINDINGS: Portal Vein Velocities  Main:  21.5 cm/sec  Right:  22.3 cm/sec  Left:  10.7 cm/sec  Hepatic Vein Velocities   Right:  38 cm/sec  Middle:  21.4 cm/sec  Left:  25.7 cm/sec  Hepatic Artery Velocity:  66.1 cm/sec  Splenic Vein Velocity:  18.7 cm/sec  Varices: Absent  Ascites: Absent  Normal appearance of the liver and no evidence for biliary dilatation. There is normal hepatofugal flow in the hepatic veins. Normal respiratory phasicity in the hepatic veins. No evidence for Budd-Chiari syndrome. Normal hepatopetal flow in the portal veins. Inferior vena cava is patent. Normal appearance of the spleen. No evidence for ascites. There is fluid in the urinary bladder. There are echogenic structures with posterior acoustic shadowing in the gallbladder and most compatible with gallstones. Gallbladder wall appears to be thickened, measuring up to 1.8 cm. In addition, the patient is reportedly tender over the gallbladder. The distal common bile duct measures 0.6 cm.  IMPRESSION: Cholelithiasis and marked gallbladder wall thickening. Findings are suggestive for acute cholecystitis.  Normal liver Doppler. Normal flow within the portal veins and hepatic veins.   Electronically Signed   By: Richarda Overlie M.D.   On: 03/20/2013 08:14    Assessment / Plan: 1) Abnormal liver enzymes.  Ultrasound with doppler flow is normal.  LFT's trending down.  Viral hepatitis panel pending.  2) Cholelithiasis with probable acute cholecystitis.   -Continue with antibiotics. -Await results of viral hepatitis studies. -Likely will need lap chole.  ERCP only  if IOC is positive.    LOS: 3 days   ZEHR, JESSICA D.  03/20/2013, 10:14 AM  Pager number [phone]  I have personally taken an interval history, reviewed the chart, and examined the patient.  I agree with the extender's note, impression and recommendations.  Once we have seen that hepatitis serologies are negative can proceed with cholecystectomy  Barbette Hair. Arlyce Dice, MD, [facility] [phone]

## 2013-03-20 NOTE — Progress Notes (Signed)
2 Days Post-Op  Subjective: She continues to improve with regard to her abdominal pain and symptoms  Objective: Vital signs in last 24 hours: Temp:  [97.8 F (36.6 C)-98.4 F (36.9 C)] 98.4 F (36.9 C) (10/12 0541) Pulse Rate:  [52-67] 52 (10/12 0541) Resp:  [16-18] 16 (10/12 0541) BP: (94-121)/(48-74) 94/48 mmHg (10/12 0541) SpO2:  [94 %-96 %] 94 % (10/12 0541) Last BM Date: 03/17/13  Intake/Output from previous day: 10/11 0701 - 10/12 0700 In: 3454 [P.O.:54; I.V.:3000; IV Piggyback:400] Out: 2875 [Urine:2875] Intake/Output this shift:    General appearance: alert, cooperative and no distress Resp: clear to auscultation bilaterally Cardio: regular rate and rhythm, S1, S2 normal, no murmur, click, rub or gallop GI: soft, mild focal RUQ tenderness, ND, no peritoneal signs, neg Murphy's  Lab Results:   Recent Labs  03/18/13 0420 03/19/13 0530  WBC 6.3 5.6  HGB 12.0 12.4  HCT 36.7 38.7  PLT 294 287   BMET  Recent Labs  03/18/13 0420 03/19/13 0530  NA 141 140  K 4.6 4.4  CL 105 105  CO2 27 28  GLUCOSE 129* 110*  BUN 11 5*  CREATININE 0.58 0.56  CALCIUM 9.1 9.4   PT/INR  Recent Labs  03/20/13 0445  LABPROT 12.8  INR 0.98   ABG No results found for this basename: PHART, PCO2, PO2, HCO3,  in the last 72 hours  Studies/Results: [place] Art/ven Flow Abd Pelv Doppler Limited  03/20/2013   CLINICAL DATA:  Abdominal pain.  Evaluate for Budd-Chiari syndrome.  EXAM: DUPLEX ULTRASOUND OF LIVER  TECHNIQUE: Color and duplex Doppler ultrasound was performed to evaluate the hepatic in-flow and out-flow vessels.  COMPARISON:  Ultrasound 03/17/2013  FINDINGS: Portal Vein Velocities  Main:  21.5 cm/sec  Right:  22.3 cm/sec  Left:  10.7 cm/sec  Hepatic Vein Velocities  Right:  38 cm/sec  Middle:  21.4 cm/sec  Left:  25.7 cm/sec  Hepatic Artery Velocity:  66.1 cm/sec  Splenic Vein Velocity:  18.7 cm/sec  Varices: Absent  Ascites: Absent  Normal appearance of the liver and no  evidence for biliary dilatation. There is normal hepatofugal flow in the hepatic veins. Normal respiratory phasicity in the hepatic veins. No evidence for Budd-Chiari syndrome. Normal hepatopetal flow in the portal veins. Inferior vena cava is patent. Normal appearance of the spleen. No evidence for ascites. There is fluid in the urinary bladder. There are echogenic structures with posterior acoustic shadowing in the gallbladder and most compatible with gallstones. Gallbladder wall appears to be thickened, measuring up to 1.8 cm. In addition, the patient is reportedly tender over the gallbladder. The distal common bile duct measures 0.6 cm.  IMPRESSION: Cholelithiasis and marked gallbladder wall thickening. Findings are suggestive for acute cholecystitis.  Normal liver Doppler. Normal flow within the portal veins and hepatic veins.   Electronically Signed   By: Richarda Overlie M.D.   On: 03/20/2013 08:14    Anti-infectives: Anti-infectives   Start     Dose/Rate Route Frequency Ordered Stop   03/17/13 1830  Ampicillin-Sulbactam (UNASYN) 3 g in sodium chloride 0.9 % 100 mL IVPB     3 g 100 mL/hr over 60 Minutes Intravenous 4 times per day 03/17/13 1815        Assessment/Plan: s/p Procedure(s): CANCELLED PROCEDURE she continues to improve clinically and she anxiously awaits surgery.  LFT's improving.  Vascular US negative for Budd chiari.  Hepatitis panel pending.  I tried to call the lab about this but it is  a send out lab.  I think that her symptoms are likely due to cholecystitis but she is not ill and we are awaiting the results of the hepatitis panel.  She does tell me that several members of her family have had liver problems which would make it more reasonable to wait for the results.  LOS: 3 days    Lodema Pilot DAVID 03/20/2013

## 2013-03-21 ENCOUNTER — Encounter (HOSPITAL_COMMUNITY): Admission: AD | Disposition: A | Discharge status: Home / Self Care | Payer: Self-pay | Source: Ambulatory Visit

## 2013-03-21 ENCOUNTER — Encounter (HOSPITAL_COMMUNITY): Payer: BC Managed Care – PPO | Admitting: Anesthesiology

## 2013-03-21 ENCOUNTER — Inpatient Hospital Stay (HOSPITAL_COMMUNITY): Payer: BC Managed Care – PPO | Admitting: Anesthesiology

## 2013-03-21 ENCOUNTER — Encounter (HOSPITAL_COMMUNITY): Payer: Self-pay | Admitting: Anesthesiology

## 2013-03-21 ENCOUNTER — Inpatient Hospital Stay (HOSPITAL_COMMUNITY): Payer: BC Managed Care – PPO

## 2013-03-21 DIAGNOSIS — K8010 Calculus of gallbladder with chronic cholecystitis without obstruction: Secondary | ICD-10-CM

## 2013-03-21 HISTORY — PX: CHOLECYSTECTOMY: SHX55

## 2013-03-21 LAB — PROTIME-INR: Prothrombin Time: 13.2 seconds (ref 11.6–15.2)

## 2013-03-21 SURGERY — LAPAROSCOPIC CHOLECYSTECTOMY WITH INTRAOPERATIVE CHOLANGIOGRAM
Anesthesia: General | Wound class: Clean Contaminated

## 2013-03-21 MED ORDER — FENTANYL CITRATE 0.05 MG/ML IJ SOLN
INTRAMUSCULAR | Status: DC | PRN
  Administered 2013-03-21 (×5): 50 ug via INTRAVENOUS

## 2013-03-21 MED ORDER — HYDROMORPHONE HCL PF 1 MG/ML IJ SOLN
0.2500 mg | INTRAMUSCULAR | Status: DC | PRN
  Administered 2013-03-21 (×4): 0.5 mg via INTRAVENOUS

## 2013-03-21 MED ORDER — SODIUM CHLORIDE 0.9 % IV SOLN
INTRAVENOUS | Status: AC
  Filled 2013-03-21: qty 3

## 2013-03-21 MED ORDER — HYDROCODONE-ACETAMINOPHEN 5-325 MG PO TABS
1.0000 | ORAL_TABLET | ORAL | Status: DC | PRN
  Administered 2013-03-21 – 2013-03-22 (×4): 2 via ORAL
  Filled 2013-03-21 (×4): qty 2

## 2013-03-21 MED ORDER — ONDANSETRON HCL 4 MG/2ML IJ SOLN
INTRAMUSCULAR | Status: DC | PRN
  Administered 2013-03-21: 4 mg via INTRAMUSCULAR

## 2013-03-21 MED ORDER — POTASSIUM CHLORIDE IN NACL 20-0.45 MEQ/L-% IV SOLN
INTRAVENOUS | Status: DC
  Administered 2013-03-21 – 2013-03-22 (×2): via INTRAVENOUS
  Filled 2013-03-21 (×3): qty 1000

## 2013-03-21 MED ORDER — GLYCOPYRROLATE 0.2 MG/ML IJ SOLN
INTRAMUSCULAR | Status: DC | PRN
  Administered 2013-03-21: 0.2 mg via INTRAVENOUS
  Administered 2013-03-21: 0.4 mg via INTRAVENOUS

## 2013-03-21 MED ORDER — LACTATED RINGERS IV SOLN: INTRAVENOUS | Status: DC

## 2013-03-21 MED ORDER — BUPIVACAINE HCL (PF) 0.25 % IJ SOLN
INTRAMUSCULAR | Status: DC | PRN
  Administered 2013-03-21: 30 mL

## 2013-03-21 MED ORDER — SODIUM CHLORIDE 0.9 % IV SOLN
3.0000 g | Freq: Once | INTRAVENOUS | Status: AC
  Administered 2013-03-21: 3 g via INTRAVENOUS
  Filled 2013-03-21: qty 3

## 2013-03-21 MED ORDER — NEOSTIGMINE METHYLSULFATE 1 MG/ML IJ SOLN
INTRAMUSCULAR | Status: DC | PRN
  Administered 2013-03-21: 3 mg via INTRAVENOUS

## 2013-03-21 MED ORDER — HYDROMORPHONE HCL PF 1 MG/ML IJ SOLN
INTRAMUSCULAR | Status: AC
  Filled 2013-03-21: qty 1

## 2013-03-21 MED ORDER — LACTATED RINGERS IV SOLN
INTRAVENOUS | Status: DC | PRN
  Administered 2013-03-21: 1000 mL via INTRAVENOUS

## 2013-03-21 MED ORDER — BUPIVACAINE HCL 0.25 % IJ SOLN
INTRAMUSCULAR | Status: AC
  Filled 2013-03-21: qty 1

## 2013-03-21 MED ORDER — ONDANSETRON HCL 4 MG PO TABS: 4.0000 mg | ORAL_TABLET | Freq: Four times a day (QID) | ORAL | Status: DC | PRN

## 2013-03-21 MED ORDER — PROPOFOL 10 MG/ML IV BOLUS
INTRAVENOUS | Status: DC | PRN
  Administered 2013-03-21: 150 mg via INTRAVENOUS

## 2013-03-21 MED ORDER — DIATRIZOATE MEGLUMINE 30 % UR SOLN
URETHRAL | Status: DC | PRN
  Administered 2013-03-21: 300 mL via URETHRAL

## 2013-03-21 MED ORDER — HEPARIN SODIUM (PORCINE) 5000 UNIT/ML IJ SOLN
5000.0000 [IU] | Freq: Three times a day (TID) | INTRAMUSCULAR | Status: DC
  Administered 2013-03-21 – 2013-03-22 (×2): 5000 [IU] via SUBCUTANEOUS
  Filled 2013-03-21 (×5): qty 1

## 2013-03-21 MED ORDER — LACTATED RINGERS IV SOLN
INTRAVENOUS | Status: DC | PRN
  Administered 2013-03-21: 10:00:00 via INTRAVENOUS

## 2013-03-21 MED ORDER — MIDAZOLAM HCL 5 MG/5ML IJ SOLN
INTRAMUSCULAR | Status: DC | PRN
  Administered 2013-03-21: 2 mg via INTRAVENOUS

## 2013-03-21 MED ORDER — 0.9 % SODIUM CHLORIDE (POUR BTL) OPTIME
TOPICAL | Status: DC | PRN
  Administered 2013-03-21: 1000 mL

## 2013-03-21 MED ORDER — ONDANSETRON HCL 4 MG/2ML IJ SOLN: 4.0000 mg | Freq: Four times a day (QID) | INTRAMUSCULAR | Status: DC | PRN

## 2013-03-21 MED ORDER — MORPHINE SULFATE 2 MG/ML IJ SOLN: 1.0000 mg | INTRAMUSCULAR | Status: DC | PRN

## 2013-03-21 MED ORDER — ROCURONIUM BROMIDE 100 MG/10ML IV SOLN
INTRAVENOUS | Status: DC | PRN
  Administered 2013-03-21: 30 mg via INTRAVENOUS

## 2013-03-21 SURGICAL SUPPLY — 41 items
APPLIER CLIP ROT 10 11.4 M/L (STAPLE) ×2
BENZOIN TINCTURE PRP APPL 2/3 (GAUZE/BANDAGES/DRESSINGS) IMPLANT
CANISTER SUCTION 2500CC (MISCELLANEOUS) ×2 IMPLANT
CHLORAPREP W/TINT 26ML (MISCELLANEOUS) ×2 IMPLANT
CHOLANGIOGRAM CATH TAUT (CATHETERS) ×2 IMPLANT
CLIP APPLIE ROT 10 11.4 M/L (STAPLE) ×1 IMPLANT
CLOTH BEACON ORANGE TIMEOUT ST (SAFETY) ×2 IMPLANT
COVER MAYO STAND STRL (DRAPES) ×2 IMPLANT
DECANTER SPIKE VIAL GLASS SM (MISCELLANEOUS) ×2 IMPLANT
DERMABOND ADVANCED (GAUZE/BANDAGES/DRESSINGS) ×1
DERMABOND ADVANCED .7 DNX12 (GAUZE/BANDAGES/DRESSINGS) ×1 IMPLANT
DRAPE C-ARM 42X120 X-RAY (DRAPES) ×2 IMPLANT
DRAPE LAPAROSCOPIC ABDOMINAL (DRAPES) ×2 IMPLANT
ELECT REM PT RETURN 9FT ADLT (ELECTROSURGICAL) ×2
ELECTRODE REM PT RTRN 9FT ADLT (ELECTROSURGICAL) ×1 IMPLANT
GLOVE BIOGEL PI IND STRL 7.0 (GLOVE) ×1 IMPLANT
GLOVE BIOGEL PI INDICATOR 7.0 (GLOVE) ×1
GLOVE SURG SIGNA 7.5 PF LTX (GLOVE) ×2 IMPLANT
GOWN PREVENTION PLUS LG XLONG (DISPOSABLE) ×2 IMPLANT
GOWN STRL REIN XL XLG (GOWN DISPOSABLE) ×4 IMPLANT
HEMOSTAT SURGICEL 4X8 (HEMOSTASIS) IMPLANT
IV CATH 14GX2 1/4 (CATHETERS) ×2 IMPLANT
IV SET EXT 30 76VOL 4 MALE LL (IV SETS) ×2 IMPLANT
KIT BASIN OR (CUSTOM PROCEDURE TRAY) ×2 IMPLANT
NS IRRIG 1000ML POUR BTL (IV SOLUTION) IMPLANT
POUCH SPECIMEN RETRIEVAL 10MM (ENDOMECHANICALS) ×2 IMPLANT
SCISSORS LAP 5X35 DISP (ENDOMECHANICALS) ×2 IMPLANT
SET IRRIG TUBING LAPAROSCOPIC (IRRIGATION / IRRIGATOR) ×2 IMPLANT
SOLUTION ANTI FOG 6CC (MISCELLANEOUS) ×2 IMPLANT
STOPCOCK K 69 2C6206 (IV SETS) ×2 IMPLANT
STRIP CLOSURE SKIN 1/4X4 (GAUZE/BANDAGES/DRESSINGS) IMPLANT
SUT VIC AB 5-0 PS2 18 (SUTURE) ×2 IMPLANT
SUT VICRYL 0 UR6 27IN ABS (SUTURE) ×2 IMPLANT
TOWEL OR 17X26 10 PK STRL BLUE (TOWEL DISPOSABLE) ×2 IMPLANT
TRAY LAP CHOLE (CUSTOM PROCEDURE TRAY) ×2 IMPLANT
TROCAR BLADELESS OPT 5 100 (ENDOMECHANICALS) ×4 IMPLANT
TROCAR XCEL BLUNT TIP 100MML (ENDOMECHANICALS) ×2 IMPLANT
TROCAR XCEL NON-BLD 11X100MML (ENDOMECHANICALS) ×2 IMPLANT
TROCAR XCEL UNIV SLVE 11M 100M (ENDOMECHANICALS) IMPLANT
TUBING INSUFFLATION 10FT LAP (TUBING) ×2 IMPLANT
WATER STERILE IRR 1500ML POUR (IV SOLUTION) ×2 IMPLANT

## 2013-03-21 NOTE — Preoperative (Signed)
Beta Blockers   Reason not to administer Beta Blockers:Not Applicable 

## 2013-03-21 NOTE — Anesthesia Postprocedure Evaluation (Signed)
Anesthesia Post Note  Patient: Chelsey Hinton  Procedure(s) Performed: Procedure(s) (LRB): LAPAROSCOPIC CHOLECYSTECTOMY WITH INTRAOPERATIVE CHOLANGIOGRAM (N/A)  Anesthesia type: General  Patient location: PACU  Post pain: Pain level controlled  Post assessment: Post-op Vital signs reviewed  Last Vitals: BP 122/69  Pulse 51  Temp(Src) 36.4 C (Oral)  Resp 12  Ht 5\' 2"  (1.575 m)  Wt 152 lb 6.4 oz (69.128 kg)  BMI 27.87 kg/m2  SpO2 100%  Post vital signs: Reviewed  Level of consciousness: sedated  Complications: No apparent anesthesia complications

## 2013-03-21 NOTE — Progress Notes (Signed)
General Surgery Note  LOS: 4 days   Assessment/Plan: 1.  Symptomatic cholelithiasis, though asymptomatic now  On Unasyn  I discussed with the patient the indications and risks of gall bladder surgery.  The primary risks of gall bladder surgery include, but are not limited to, bleeding, infection, common bile duct injury, and open surgery.  There is also the risk that the patient may have continued symptoms after surgery.  However, the likelihood of improvement in symptoms and return to the patient's normal status is good. We discussed the typical post-operative recovery course. I tried to answer the patient's questions.  Will plan surgery later today. 2.  Hepatitis panel negative  Improving LFT's 3.  Migraine headaches 4.  GERD 5.  DVT prophylaxis - none  Subjective:  Doing well.  She has not eaten for 4 days.  She is ready to go ahead with surgery.  She is originally from [place].  She works as a Advertising copywriter. Objective:   Filed Vitals:   03/21/13 0550  BP: 96/61  Pulse: 55  Temp: 97.9 F (36.6 C)  Resp: 16     Intake/Output from previous day:  10/12 0701 - 10/13 0700 In: 3400 [I.V.:3000; IV Piggyback:400] Out: 3000 [Urine:3000]  Intake/Output this shift:      Physical Exam:   General: WN Hispanic F who is alert and oriented.    HEENT: Normal. Pupils equal. .   Lungs: Clear   Abdomen: Soft.  No scars.     Lab Results:    Recent Labs  03/19/13 0530  WBC 5.6  HGB 12.4  HCT 38.7  PLT 287    BMET   Recent Labs  03/19/13 0530  NA 140  K 4.4  CL 105  CO2 28  GLUCOSE 110*  BUN 5*  CREATININE 0.56  CALCIUM 9.4    PT/INR   Recent Labs  03/20/13 0445 03/21/13 0420  LABPROT 12.8 13.2  INR 0.98 1.02    ABG  No results found for this basename: PHART, PCO2, PO2, HCO3,  in the last 72 hours   Studies/Results:  [place] Art/ven Flow Abd Pelv Doppler Limited  03/20/2013   CLINICAL DATA:  Abdominal pain.  Evaluate for Budd-Chiari syndrome.  EXAM: DUPLEX ULTRASOUND  OF LIVER  TECHNIQUE: Color and duplex Doppler ultrasound was performed to evaluate the hepatic in-flow and out-flow vessels.  COMPARISON:  Ultrasound 03/17/2013  FINDINGS: Portal Vein Velocities  Main:  21.5 cm/sec  Right:  22.3 cm/sec  Left:  10.7 cm/sec  Hepatic Vein Velocities  Right:  38 cm/sec  Middle:  21.4 cm/sec  Left:  25.7 cm/sec  Hepatic Artery Velocity:  66.1 cm/sec  Splenic Vein Velocity:  18.7 cm/sec  Varices: Absent  Ascites: Absent  Normal appearance of the liver and no evidence for biliary dilatation. There is normal hepatofugal flow in the hepatic veins. Normal respiratory phasicity in the hepatic veins. No evidence for Budd-Chiari syndrome. Normal hepatopetal flow in the portal veins. Inferior vena cava is patent. Normal appearance of the spleen. No evidence for ascites. There is fluid in the urinary bladder. There are echogenic structures with posterior acoustic shadowing in the gallbladder and most compatible with gallstones. Gallbladder wall appears to be thickened, measuring up to 1.8 cm. In addition, the patient is reportedly tender over the gallbladder. The distal common bile duct measures 0.6 cm.  IMPRESSION: Cholelithiasis and marked gallbladder wall thickening. Findings are suggestive for acute cholecystitis.  Normal liver Doppler. Normal flow within the portal veins and hepatic veins.  Electronically Signed   By: Richarda Overlie M.D.   On: 03/20/2013 08:14     Anti-infectives:   Anti-infectives   Start     Dose/Rate Route Frequency Ordered Stop   03/17/13 1830  Ampicillin-Sulbactam (UNASYN) 3 g in sodium chloride 0.9 % 100 mL IVPB     3 g 100 mL/hr over 60 Minutes Intravenous 4 times per day 03/17/13 1815        Ovidio Kin, MD, FACS Pager: [phone] [facility] Office: [phone] 03/21/2013

## 2013-03-21 NOTE — Op Note (Signed)
03/17/2013 - 03/21/2013  12:28 PM  PATIENT:  Chelsey Hinton, 52 y.o., female, MRN: [id number]  PREOP DIAGNOSIS:  gallstones  POSTOP DIAGNOSIS:   Chronic cholecystitis, cholelithiasis, cystic duct originated from the right hepatic duct  PROCEDURE:   Procedure(s): LAPAROSCOPIC CHOLECYSTECTOMY WITH INTRAOPERATIVE CHOLANGIOGRAM  SURGEON:   Ovidio Kin, M.D.  ASSISTANT:   Gwyndolyn Kaufman, M.D.  ANESTHESIA:   general  Anesthesiologist: Einar Pheasant, MD; Gaylan Gerold, MD CRNA: Thornell Mule, CRNA; Randon Goldsmith  General  ASA: @asa @  EBL:  Minimal  ml  BLOOD ADMINISTERED: none  DRAINS: none   LOCAL MEDICATIONS USED:   30 cc 1/4 % marcaine  SPECIMEN:   Gall bladder  COUNTS CORRECT:  YES  INDICATIONS FOR PROCEDURE:  Chelsey Hinton is a 52 y.o. (DOB: 10/30/1960) hispanic  female whose primary care physician is CLOWARD,DAVIS L, MD and comes for cholecystectomy.   The indications and risks of the gall bladder surgery were explained to the patient.  The risks include, but are not limited to, infection, bleeding, common bile duct injury and open surgery.  SURGERY:  The patient was taken to room #6 at [facility].  The abdomen was prepped with chloroprep.  The patient was already on Unasyn as an antibiotic pre op.   A time out was held and the surgical checklist run.   An infraumbilical incision was made into the abdominal cavity.  A 12 mm Hasson trocar was inserted into the abdominal cavity through the infraumbilical incision and secured with a 0 Vicryl suture.  Three additional trocars were inserted: a 10 mm trocar in the sub-xiphoid location, a 5 mm trocar in the right mid subcostal area, and a 5 mm trocar in the right lateral subcostal area.   The abdomen was explored and the liver, stomach, and bowel that could be seen were unremarkable.  The gall bladder had moderate amount of adhesins around it, consistent with chronic cholecystitis.   The gall bladder was identified,  grasped, and rotated cephalad.  Disssection was carried down to the gall bladder/cystic duct junction and the cystic duct isolated.  A clip was placed on the gall bladder side of the cystic duct.   An intra-operative cholangiogram was shot.   The intra-operative cholangiogram was shot using a cut off Taut catheter placed through a 14 gauge angiocath in the RUQ.  The Taut catheter was inserted in the cut cystic duct and secured with an endoclip.  A cholangiogram was shot with 10 cc of 1/2 strength Omnipaque.  Using fluoroscopy, the cholangiogram showed the flow of contrast into the common bile duct, up the hepatic radicals, and into the duodenum.  There was no mass or obstruction.  The cystic duct came off the right hepatic duct.  Otherwise, this was a normal intra-operative cholangiogram.   The Taut catheter was removed.  The cystic duct was tripley endoclipped and the cystic artery was identified and clipped.  The gall bladder was bluntly and sharpley dissected from the gall bladder bed.   After the gall bladder was removed from the liver, the gall bladder bed and [place] were inspected.  There was no bleeding or bile leak.  The gall bladder was placed in a endocatch bag and delivered through the umbilicus.  The bag ruptured removing the gall bladder, but the gall bladder was intact when removed. The abdomen was irrigated with 800 cc saline.   The trocars were then removed.  I infiltrated 30cc of 1/4% Marcaine into  the incisions.  The umbilical port closed with a 0 Vicryl suture and the skin closed with 5-0 vicryl.  The skin was painted with Dermabond.  The patient's sponge and needle count were correct.  The patient was transported to the RR in good condition.  Ovidio Kin, MD, [facility] Surgery Pager: [phone] Office phone:  [phone]

## 2013-03-21 NOTE — Anesthesia Preprocedure Evaluation (Addendum)
Anesthesia Evaluation  Patient identified by MRN, date of birth, ID band Patient awake    Reviewed: Allergy & Precautions, H&P , NPO status , Patient's Chart, lab work & pertinent test results  Airway  TM Distance: >3 FB Neck ROM: Full    Dental  (+) Teeth Intact, Caps and Dental Advisory Given   Pulmonary neg pulmonary ROS,  breath sounds clear to auscultation  Pulmonary exam normal       Cardiovascular negative cardio ROS  Rate:Normal     Neuro/Psych negative neurological ROS  negative psych ROS   GI/Hepatic GERD-  ,(+) Hepatitis -  Endo/Other  negative endocrine ROS  Renal/GU negative Renal ROS  negative genitourinary   Musculoskeletal negative musculoskeletal ROS (+)   Abdominal   Peds  Hematology negative hematology ROS (+)   Anesthesia Other Findings   Reproductive/Obstetrics                           Anesthesia Physical Anesthesia Plan  ASA: II  Anesthesia Plan: General   Post-op Pain Management:    Induction: Intravenous  Airway Management Planned: Oral ETT  Additional Equipment:   Intra-op Plan:   Post-operative Plan: Extubation in OR  Informed Consent: I have reviewed the patients History and Physical, chart, labs and discussed the procedure including the risks, benefits and alternatives for the proposed anesthesia with the patient or authorized representative who has indicated his/her understanding and acceptance.   Dental advisory given  Plan Discussed with: CRNA  Anesthesia Plan Comments:         Anesthesia Quick Evaluation

## 2013-03-21 NOTE — Transfer of Care (Signed)
Immediate Anesthesia Transfer of Care Note  Patient: Chelsey Hinton  Procedure(s) Performed: Procedure(s): LAPAROSCOPIC CHOLECYSTECTOMY WITH INTRAOPERATIVE CHOLANGIOGRAM (N/A)  Patient Location: PACU  Anesthesia Type:General  Level of Consciousness: awake, alert  and oriented  Airway & Oxygen Therapy: Patient Spontanous Breathing and Patient connected to face mask oxygen  Post-op Assessment: Report given to PACU RN and Post -op Vital signs reviewed and stable  Post vital signs: Reviewed and stable  Complications: No apparent anesthesia complications

## 2013-03-22 ENCOUNTER — Encounter: Payer: Self-pay | Admitting: General Surgery

## 2013-03-22 ENCOUNTER — Encounter (HOSPITAL_COMMUNITY): Payer: Self-pay | Admitting: Surgery

## 2013-03-22 DIAGNOSIS — K811 Chronic cholecystitis: Secondary | ICD-10-CM | POA: Diagnosis present

## 2013-03-22 LAB — COMPREHENSIVE METABOLIC PANEL
ALT: 189 U/L — ABNORMAL HIGH (ref 0–35)
AST: 58 U/L — ABNORMAL HIGH (ref 0–37)
Albumin: 2.8 g/dL — ABNORMAL LOW (ref 3.5–5.2)
CO2: 29 mEq/L (ref 19–32)
Calcium: 9.1 mg/dL (ref 8.4–10.5)
Chloride: 102 mEq/L (ref 96–112)
GFR calc Af Amer: >90 mL/min (ref >90)
GFR calc non Af Amer: >90 mL/min (ref >90)
Potassium: 4.1 mEq/L (ref 3.5–5.1)
Sodium: 137 mEq/L (ref 135–145)
Total Bilirubin: 1.2 mg/dL (ref 0.3–1.2)

## 2013-03-22 MED ORDER — ACETAMINOPHEN 325 MG PO TABS: ORAL_TABLET | ORAL | Status: DC

## 2013-03-22 MED ORDER — HYDROCODONE-ACETAMINOPHEN 5-325 MG PO TABS: 1.0000 | ORAL_TABLET | ORAL | Status: DC | PRN

## 2013-03-22 NOTE — Progress Notes (Signed)
Assessment unchanged. Ambulating and tolerated lunch. Pt verbalized understanding of dc instructions through teach back. Pt understands when to follow up with MD. Introduced to My Chart. Relative at bedside plans to assist pr in signing up. Script x 1 given to pt as well as work release note as provided by MD. Discharged to front entrance via wc to meet awaiting vehicle to carry home. Accompanied by NT and relative x 1.

## 2013-03-22 NOTE — Discharge Summary (Signed)
Physician Discharge Summary  Patient ID: Chelsey Hinton MRN: [id number] DOB/AGE: 54-Jun-1962 54 y.o.  Admit date: 03/17/2013 Discharge date: 03/22/2013  Admitting Diagnosis: Symptomatic cholelithiasis Elevated liver enzymes  Discharge Diagnosis Patient Active Problem List   Diagnosis Date Noted  . Chronic cholecystitis 03/22/2013  . Cholecystolithiasis 03/19/2013  . Transaminitis possible cholecystitis 03/19/2013  . Steatohepatitis 03/19/2013  . GERD (gastroesophageal reflux disease)   . 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 BREAST PAIN 04/08/2010  . CHEST PAIN 04/08/2010    Consultants Dr. Mann/Dr. Elnoria Howard (GI)  Imaging: Dg Cholangiogram Operative  03/21/2013   CLINICAL DATA:  Cholelithiasis and acute cholecystitis. Laparoscopic cholecystectomy.  EXAM: INTRAOPERATIVE CHOLANGIOGRAM  TECHNIQUE: Cholangiographic images from the C-arm fluoroscopic device were submitted for interpretation post-operatively. Please see the procedural report for the amount of contrast and the fluoroscopy time utilized.  COMPARISON:  Abdominal ultrasound 03/17/2013.  FINDINGS: The cannula is present in the cystic duct remnant. There is excellent opacification of the common bile duct, common hepatic duct, and central intrahepatic ducts. No filling defects to suggest retained stones. Excellent antegrade flow into the duodenum. There is apparent ectopic insertion of the cystic duct into an accessory right intrahepatic duct, and this duct joins the common hepatic duct at the hilum or just outside the liver edge.  The radiologic technologist documented 12 seconds of fluoroscopy time.  IMPRESSION: 1. No evidence of retained bile duct stones or biliary obstruction. 2. Apparent ectopic insertion of the cystic duct into an accessory right intrahepatic duct.   Electronically Signed   By: Hulan Saas M.D.   On: 03/21/2013 14:19    Procedures Dr. Ezzard Standing (03/21/13) - Laparoscopic Cholecystectomy with [facility]  Hospital Course:  52  y.o. female.  for evaluation ofsymptomatic cholelithiasis. She was in her usual state of health until about 3:00 on 03/17/13 when she was awoken from her sleep with severe epigastric abdominal pain. She has no radiation of the pain. She tried to take some Pepto-Bismol and did not get any relief with this and she began vomiting and she went to [facility] ED for evaluation. She had an ultrasound which demonstrated multiple gallstones and a mildly thickened wall of the airway but the count was normal and her transaminases were only slightly elevated and she was discharged home. She does have some chills but no fevers and she says that her bowels are normal without any blood in the stools. She has been taking PPIs for a history of reflux and she has also had upper and lower endoscopy about 4 months ago which were normal by the patient reports except for some small polyps.   She was referred to see Dr. Biagio Quint in the [facility] office and thus she was admitted directly to [facility] hospital secondary to her continued symptoms and inability to take PO.  GI was consulted secondary to her elevated liver enzymes and a Hepatitis workout was negative.  GI did not feel ERCP would be beneficial, thus we proceeded with the above procedure.  She tolerated procedure well and was transferred to the floor.  Diet was advanced as tolerated.   On POD #1, the patient was voiding well, tolerating diet, ambulating well, pain well controlled, vital signs stable, incisions c/d/i and felt stable for discharge home.  Patient will follow up in our office in 3 weeks and knows to call with questions or concerns.  Physical Exam: General:  Alert, NAD, pleasant, comfortable Abd:  Soft, ND, mild tenderness, incisions C/D/I    Medication List  bismuth subsalicylate 262 MG chewable tablet  Commonly known as:  PEPTO BISMOL  Chew 524 mg by mouth as needed for indigestion.     DEXILANT 30 MG capsule  Generic drug:  Dexlansoprazole  Take 30 mg by mouth  daily.     fexofenadine 30 MG tablet  Commonly known as:  ALLEGRA  Take 30 mg by mouth 2 (two) times daily.     lansoprazole 30 MG capsule  Commonly known as:  PREVACID  Take 30 mg by mouth daily.     ondansetron 4 MG tablet  Commonly known as:  ZOFRAN  Take 1 tablet (4 mg total) by mouth every 6 (six) hours.             Follow-up Information   Follow up with [facility] On 04/12/2013. (Your appointment is at 3:45pm, please arrive at least 30 minutes before your appointment to complete your check in paperwork.  If you are unable to arrive 30 minutes prior to your appointment time we may have to cancel or reschedule you.)    Contact information:   [address]   [place] [address] [phone]       Signed: Candiss [facility] Surgery [phone]  03/22/2013, 10:07 AM  Agree with above.  Ovidio Kin, MD, [facility] Surgery Pager: [phone] Office phone:  [phone]

## 2013-03-22 NOTE — Progress Notes (Signed)
1 Day Post-Op  Subjective: Feels better afraid to eat.  Ambulating some.  Objective: Vital signs in last 24 hours: Temp:  [97.3 F (36.3 C)-98.6 F (37 C)] 98 F (36.7 C) (10/14 0516) Pulse Rate:  [50-66] 52 (10/14 0516) Resp:  [12-16] 16 (10/14 0516) BP: (92-122)/(38-70) 92/60 mmHg (10/14 0516) SpO2:  [95 %-100 %] 97 % (10/14 0516) Last BM Date: 03/17/13 480 PO, recorded yesterday, with low fat diet.   Afebrile, BP is down some, but most likely at her baseline LFT's improving Intake/Output from previous day: 10/13 0701 - 10/14 0700 In: 3436.7 [P.O.:480; I.V.:2956.7] Out: 1410 [Urine:1400; Blood:10] Intake/Output this shift: Total I/O In: -  Out: 700 [Urine:700]  General appearance: alert, cooperative and no distress GI: soft tender, some ecchymosis around the umbilical port.  Lab Results:  No results found for this basename: WBC, HGB, HCT, PLT,  in the last 72 hours  BMET  Recent Labs  03/22/13 0509  NA 137  K 4.1  CL 102  CO2 29  GLUCOSE 99  BUN 5*  CREATININE 0.60  CALCIUM 9.1   PT/INR  Recent Labs  03/20/13 0445 03/21/13 0420  LABPROT 12.8 13.2  INR 0.98 1.02     Recent Labs Lab 03/17/13 1100 03/18/13 0420 03/19/13 0530 03/20/13 0445 03/22/13 0509  AST 54* 1028* 275* 107* 58*  ALT 38* 885* 589* 360* 189*  ALKPHOS 89 167* 176* 150* 126*  BILITOT 0.9 1.8* 2.0* 1.4* 1.2  PROT 8.6* 6.9 7.1 7.0 6.1  ALBUMIN 4.0 3.0* 3.0* 3.0* 2.8*     Lipase     Component Value Date/Time   LIPASE 34 03/17/2013 1100     Studies/Results: Dg Cholangiogram Operative  03/21/2013   CLINICAL DATA:  Cholelithiasis and acute cholecystitis. Laparoscopic cholecystectomy.  EXAM: INTRAOPERATIVE CHOLANGIOGRAM  TECHNIQUE: Cholangiographic images from the C-arm fluoroscopic device were submitted for interpretation post-operatively. Please see the procedural report for the amount of contrast and the fluoroscopy time utilized.  COMPARISON:  Abdominal ultrasound  03/17/2013.  FINDINGS: The cannula is present in the cystic duct remnant. There is excellent opacification of the common bile duct, common hepatic duct, and central intrahepatic ducts. No filling defects to suggest retained stones. Excellent antegrade flow into the duodenum. There is apparent ectopic insertion of the cystic duct into an accessory right intrahepatic duct, and this duct joins the common hepatic duct at the hilum or just outside the liver edge.  The radiologic technologist documented 12 seconds of fluoroscopy time.  IMPRESSION: 1. No evidence of retained bile duct stones or biliary obstruction. 2. Apparent ectopic insertion of the cystic duct into an accessory right intrahepatic duct.   Electronically Signed   By: Hulan Saas M.D.   On: 03/21/2013 14:19    Medications: . heparin subcutaneous  5,000 Units Subcutaneous Q8H    Assessment/Plan 1.  Chronic cholecystitis, cholelithiasis, cystic duct originated from the right hepatic duct LAPAROSCOPIC CHOLECYSTECTOMY WITH INTRAOPERATIVE CHOLANGIOGRAM, 03/21/2013, Kandis Cocking, MD 2. Hepatitis panel negative  Improving LFT's  3. Migraine headaches  4. GERD  5. DVT prophylaxis - none  Plan:  Advance diet, and if she does well home after lunch.   LOS: 5 days    Chelsey Hinton,Chelsey Hinton 03/22/2013  Agree with above.  Ovidio Kin, MD, [facility] Surgery Pager: [phone] Office phone:  [phone]

## 2013-03-23 NOTE — ED Provider Notes (Signed)
Medical screening examination/treatment/procedure(s) were performed by non-physician practitioner and as supervising physician I was immediately available for consultation/collaboration.    Derwood Kaplan, MD 03/23/13 2123

## 2013-04-12 ENCOUNTER — Ambulatory Visit (INDEPENDENT_AMBULATORY_CARE_PROVIDER_SITE_OTHER): Payer: BC Managed Care – PPO | Admitting: General Surgery

## 2013-04-12 ENCOUNTER — Encounter (INDEPENDENT_AMBULATORY_CARE_PROVIDER_SITE_OTHER): Payer: Self-pay | Admitting: General Surgery

## 2013-04-12 VITALS — BP 136/72 | HR 76 | Resp 16 | Ht 62.0 in | Wt 153.6 lb

## 2013-04-12 DIAGNOSIS — K8000 Calculus of gallbladder with acute cholecystitis without obstruction: Secondary | ICD-10-CM

## 2013-04-12 NOTE — Progress Notes (Signed)
MELONEE GERSTEL 01/26/1961 [id number] 04/12/2013   Chelsey Hinton is a 52 y.o. female who had a laparoscopic cholecystectomy with intraoperative cholangiogram by Dr. Ezzard Standing.  The pathology report confirmed chronic cholecystitis and cholelithiasis.  The patient reports that they are feeling well with normal bowel movements and good appetite.  The pre-operative symptoms of abdominal pain, nausea, and vomiting have resolved.  She is having problems with her reflux  Physical examination - Incisions appear well-healed with no sign of infection or bleeding.   Abdomen - soft, non-tender  Impression:  s/p laparoscopic cholecystectomy  Plan:  She may resume a regular diet and full activity.  She may follow-up on a PRN basis.  She needs to follow up with Dr. Loreta Ave to possibly switch her Dexilant to something different or increase the dosage for worsening of her symptoms.

## 2013-04-12 NOTE — Patient Instructions (Signed)
Follow up as needed Call Dr. Loreta Ave about your reflux

## 2013-04-21 ENCOUNTER — Encounter (HOSPITAL_COMMUNITY): Payer: Self-pay | Admitting: Surgery

## 2013-05-12 ENCOUNTER — Other Ambulatory Visit: Payer: Self-pay | Admitting: Gastroenterology

## 2013-05-12 DIAGNOSIS — R109 Unspecified abdominal pain: Secondary | ICD-10-CM

## 2013-05-18 ENCOUNTER — Other Ambulatory Visit: Payer: BC Managed Care – PPO

## 2013-05-25 ENCOUNTER — Ambulatory Visit
Admission: RE | Admit: 2013-05-25 | Discharge: 2013-05-25 | Disposition: A | Discharge status: Home / Self Care | Payer: BC Managed Care – PPO | Source: Ambulatory Visit | Attending: Gastroenterology | Admitting: Gastroenterology

## 2013-05-25 DIAGNOSIS — R109 Unspecified abdominal pain: Secondary | ICD-10-CM

## 2013-05-25 MED ORDER — GADOBENATE DIMEGLUMINE 529 MG/ML IV SOLN
14.0000 mL | Freq: Once | INTRAVENOUS | Status: AC | PRN
  Administered 2013-05-25: 14 mL via INTRAVENOUS

## 2013-06-13 ENCOUNTER — Encounter (HOSPITAL_COMMUNITY): Payer: Self-pay | Admitting: Anesthesiology

## 2013-06-17 ENCOUNTER — Ambulatory Visit (INDEPENDENT_AMBULATORY_CARE_PROVIDER_SITE_OTHER): Payer: BC Managed Care – PPO | Admitting: General Surgery

## 2013-07-06 ENCOUNTER — Ambulatory Visit (INDEPENDENT_AMBULATORY_CARE_PROVIDER_SITE_OTHER): Payer: BC Managed Care – PPO | Admitting: General Surgery

## 2013-07-06 ENCOUNTER — Encounter (INDEPENDENT_AMBULATORY_CARE_PROVIDER_SITE_OTHER): Payer: Self-pay | Admitting: General Surgery

## 2013-07-06 VITALS — BP 128/70 | HR 65 | Temp 97.6°F | Resp 14 | Ht 62.0 in | Wt 153.2 lb

## 2013-07-06 DIAGNOSIS — A630 Anogenital (venereal) warts: Secondary | ICD-10-CM

## 2013-07-06 NOTE — Patient Instructions (Signed)
Use Mederma on your scar.

## 2013-07-06 NOTE — Progress Notes (Signed)
Subjective:     Patient ID: Chelsey Hinton, female   DOB: March 08, 1961, 53 y.o.   MRN: [id number]  HPIMs. Chelsey Hinton is here for followup of her anal condyloma. Since I saw her last, she underwent a laparoscopic cholecystectomy. She is having some discomfort from the epigastric incision. No anal discharge. No anal pain or itching.   Review of Systems     Objective:   Physical Exam Gen.-she looks well and is in no acute distress.  Abdomen-soft, there is a tender hypertrophic scar in the epigastric region. No evidence of hernia.  Anal rectal-no external lesions or fissures. On digital rectal exam no masses or irregularities felt. I. An anoscopy-no irregularities, masses or condyloma noted    Assessment:     1. 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  We discussed the role of HPV in this. We discussed the possibility of recurrence.  2. Hypertrophic scar     Plan:     I recommended Mederma for the scar.  Return visit in one year.

## 2014-05-02 IMAGING — CT CT ABD-PELV W/ CM
2 of 5 series · 16 of 46 positions shown, 18 images · IV contrast (30CC OMNI 300 & [ID] OMNI 300)
Comparison: None.

CLINICAL DATA: Epigastric pain.  EGD on 10/06/2012 reportedly
demonstrated extrinsic compression of the stomach.  Globus
sensation.

CT ABDOMEN AND PELVIS WITH CONTRAST
TECHNIQUE: Multidetector CT imaging of the abdomen and pelvis was
performed following the standard protocol during bolus
administration of intravenous contrast.
Contrast: 100mL OMNIPAQUE IOHEXOL 300 MG/ML  SOLN

[Series 2: abd/pelvis with · axial · 0.73mm/px · z∈[-389,-14]mm · 13 of 83 slices shown, 15 images]
[im 5/83  soft-tissue]
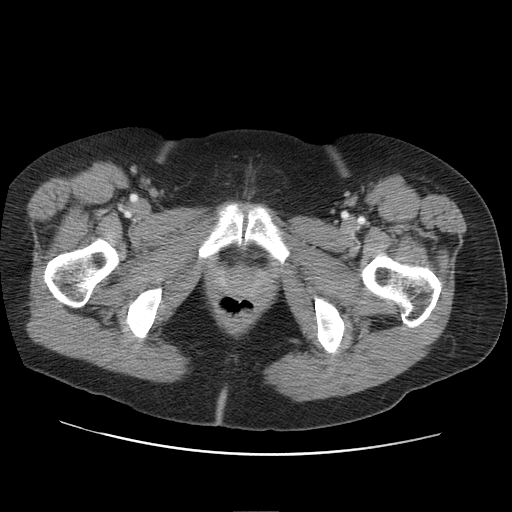
[im 5/83  bone]
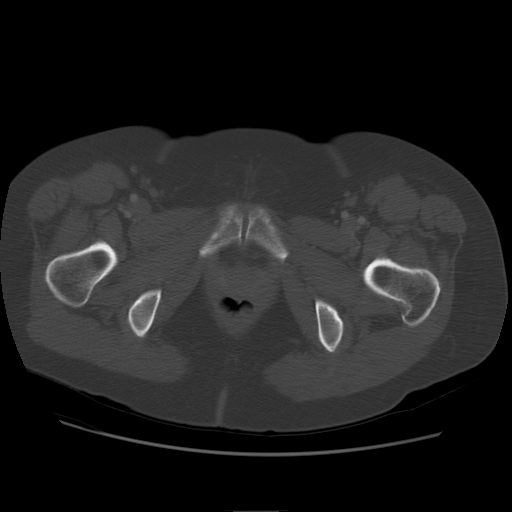
[im 10/83  soft-tissue]
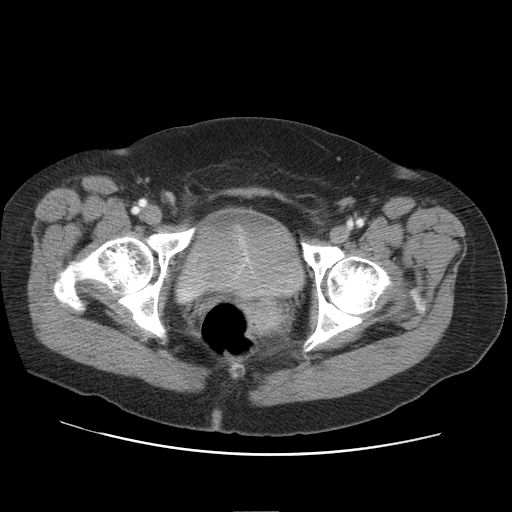
[im 20/83  soft-tissue]
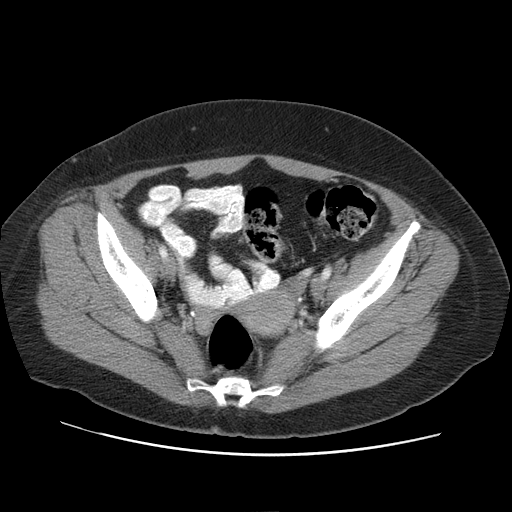
[im 25/83  soft-tissue]
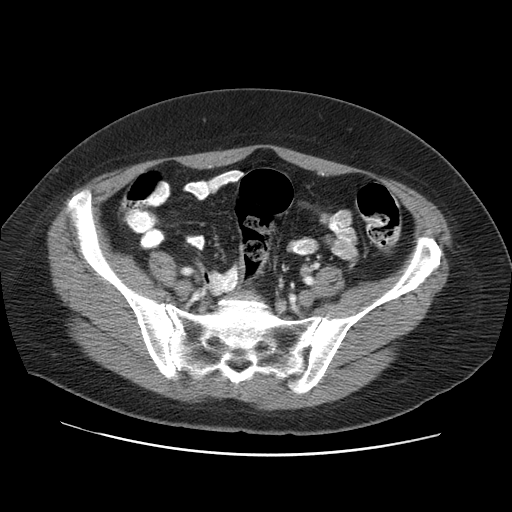
[im 29/83  soft-tissue]
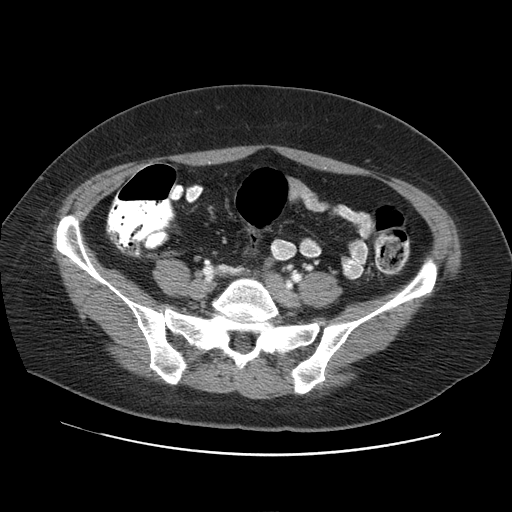
[im 34/83  soft-tissue]
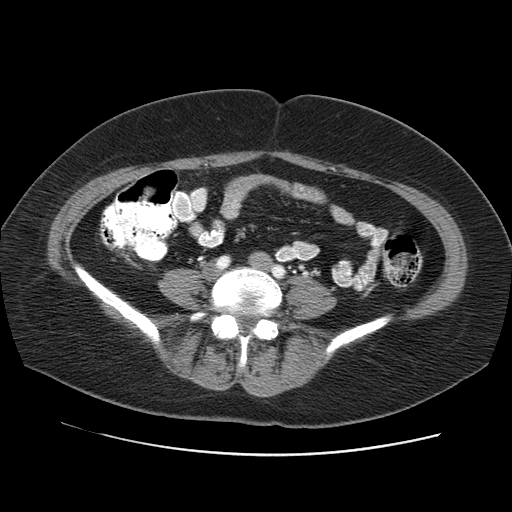
[im 44/83  soft-tissue]
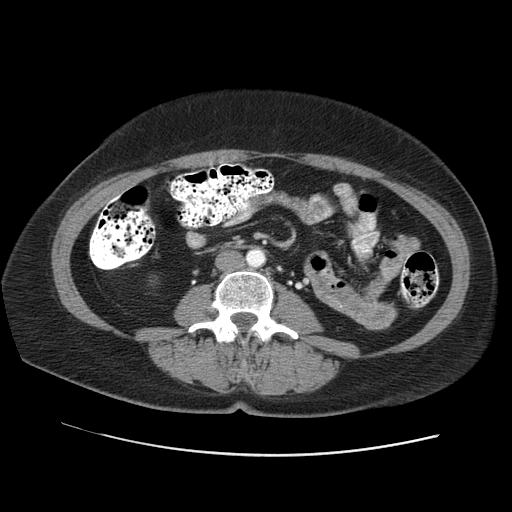
[im 49/83  soft-tissue]
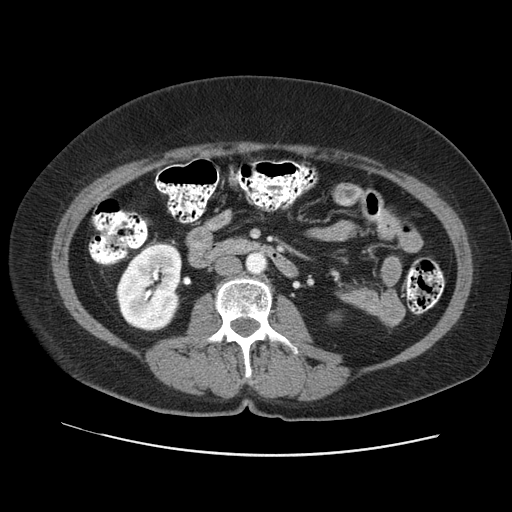
[im 54/83  soft-tissue]
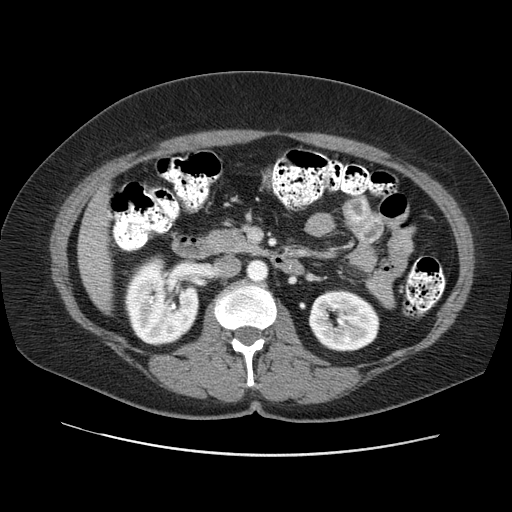
[im 54/83  bone]
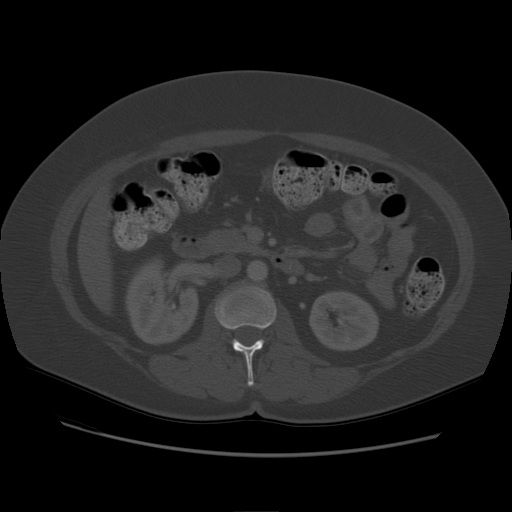
[im 58/83  soft-tissue]
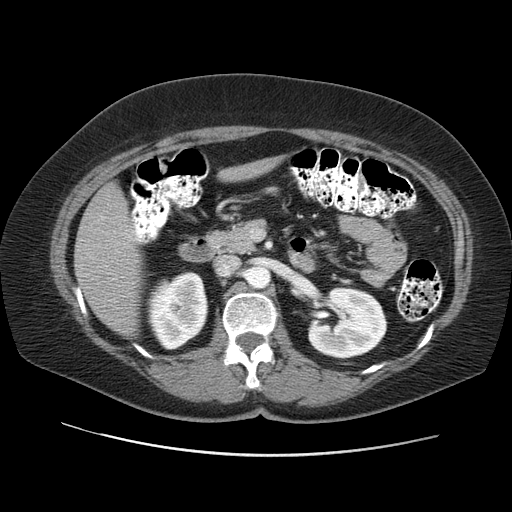
[im 63/83  soft-tissue]
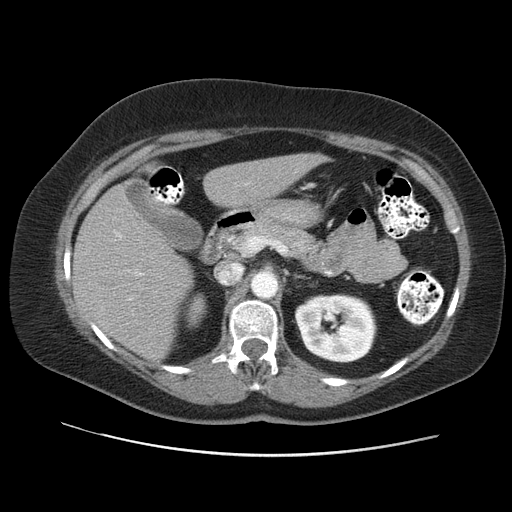
[im 73/83  soft-tissue]
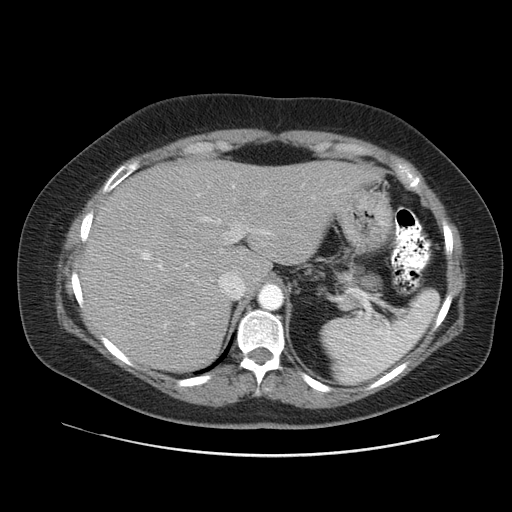
[im 78/83  soft-tissue]
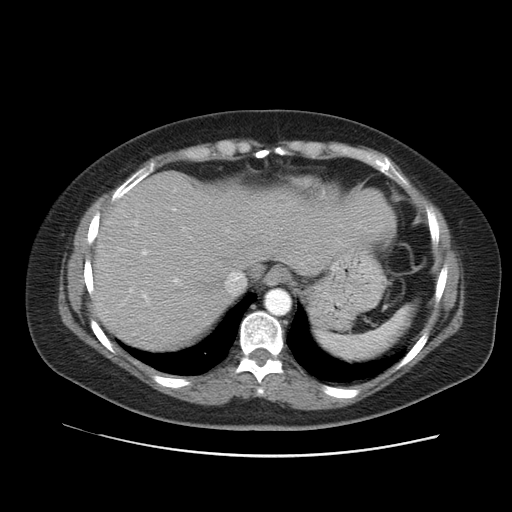

[Series 400: cor · coronal · 0.91mm/px · 3 of 138 slices shown]
[im 46/138  soft-tissue]
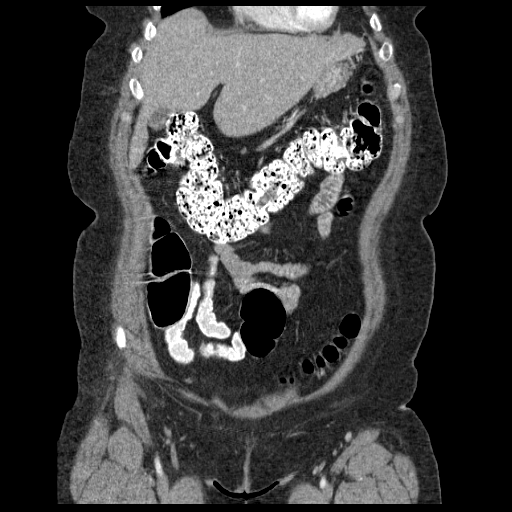
[im 61/138  soft-tissue]
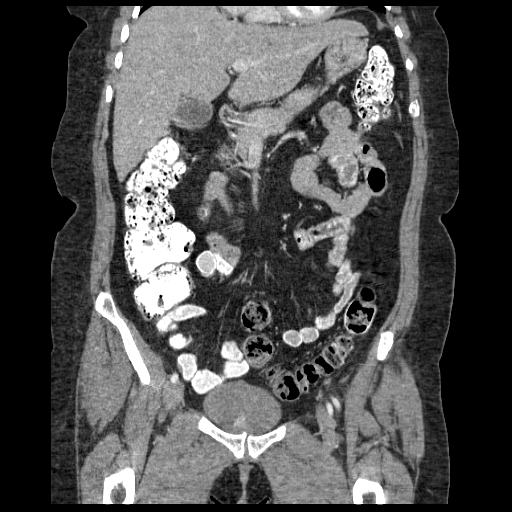
[im 77/138  soft-tissue]
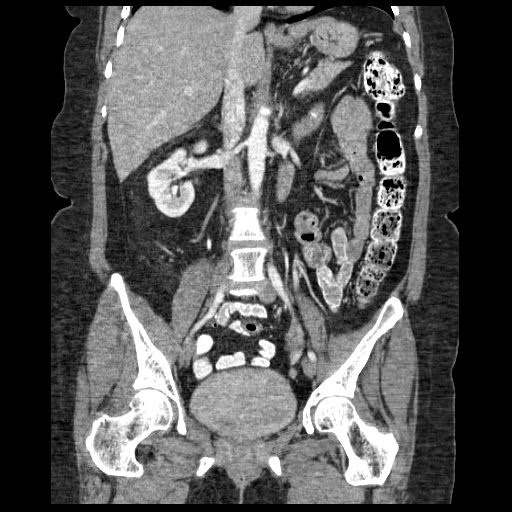

[16 of 46 positions shown; findings below may reference images not displayed]

FINDINGS: The dome of the right hepatic lobe was excluded.  Hepatic
parenchymal hypodensity favors steatosis.  The stomach antrum is
interposed between the pancreas and lateral segment left hepatic
lobe in such a way that were insufflated it might simulate an
extrinsic compression.  No abnormal mass lesion is observed in this
region.  Appears to be a gastric diverticulum in the vicinity of
the cardia/fundus.

Spleen, adrenal glands, and pancreas unremarkable.  On initial
images, there is contrast medium in the renal collecting systems
suggesting a test injection, and while there is no evidence of
obstruction this can reduce sensitivity for renal calculi.  No
abnormal low density filling defect is observed in the ureters or
collecting systems.  Renal parenchymal enhancement appears normal.

No pathologic retroperitoneal or porta hepatis adenopathy is
identified.

No pathologic pelvic adenopathy is identified.

Appendix normal.  Terminal ileum unremarkable.  Orally administered
contrast extends through the descending colon.

Bilateral jets of ureteral contrast noted in the urinary bladder,
indicative of ureteral patency. The uterus and adnexa appear
unremarkable.

No free pelvic fluid.

I do not observe a mesenteric mass.  Degenerative endplate
sclerosis is noted on the right side at L4-5.

Celiac trunk, superior mesenteric artery, and inferior mesenteric
artery appear patent.
IMPRESSION: 1.  Small gastric diverticulum in the vicinity of the
cardia/fundus.

2.  No mass lesion to cause extrinsic compression of the stomach is
observed.  Stomach antrum is interposed between the left hepatic
lobe in the pancreas, and if insufflated might simulate an
extrinsic mass.
3.  Suspected mild hepatic steatosis.

## 2014-10-04 IMAGING — US US ART/VEN ABD/PELV/SCROTUM DOPPLER COMPLETE
1 series · 13 of 25 positions shown · non-contrast
Comparison: Ultrasound 03/17/2013

CLINICAL DATA: Abdominal pain.  Evaluate for Budd-Chiari syndrome.

EXAM:
DUPLEX ULTRASOUND OF LIVER
TECHNIQUE: Color and duplex Doppler ultrasound was performed to evaluate the
hepatic in-flow and out-flow vessels.

[Series 1: us art/ven abd/pelv/scrotum doppler complete · 0.27mm/px · 13 of 67 slices shown]
[im 1/67]
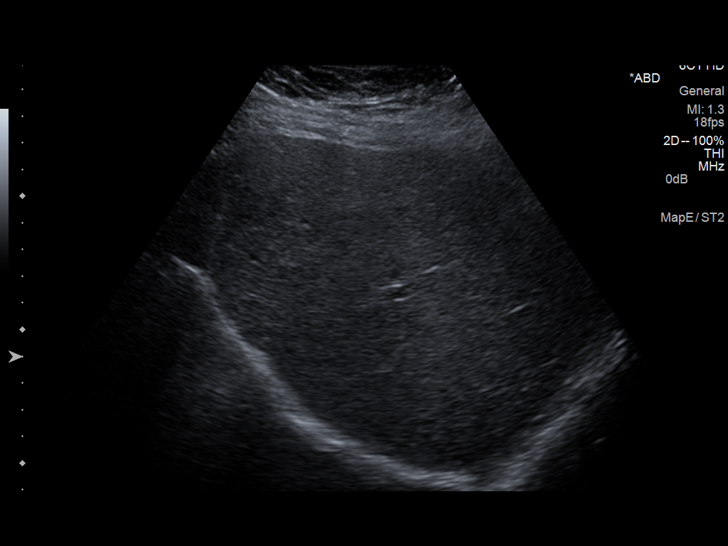
[im 6/67]
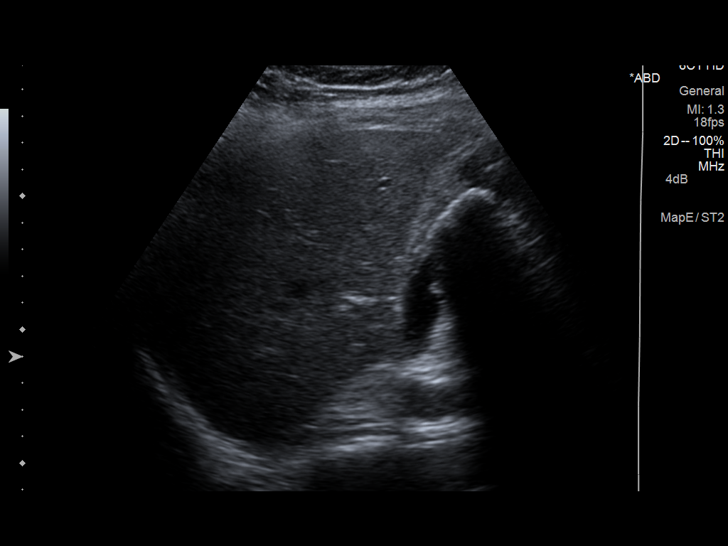
[im 12/67]
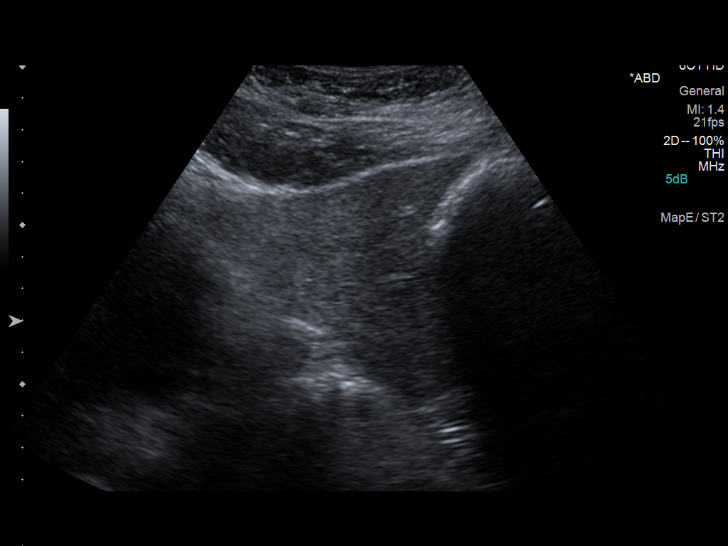
[im 17/67]
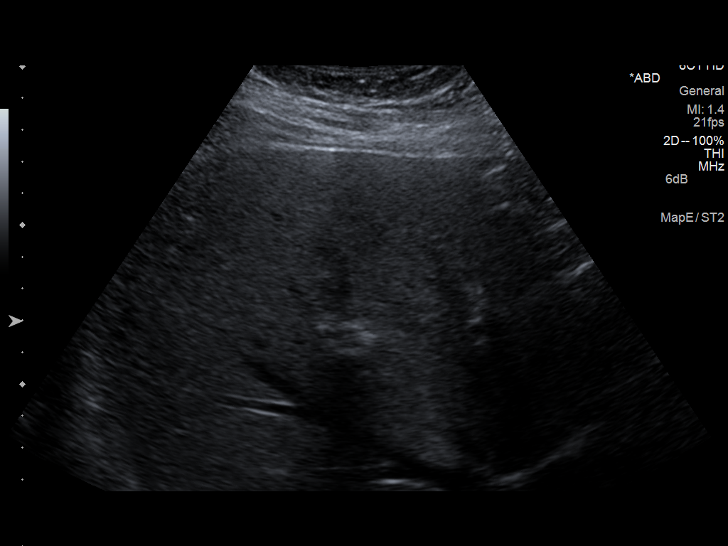
[im 23/67]
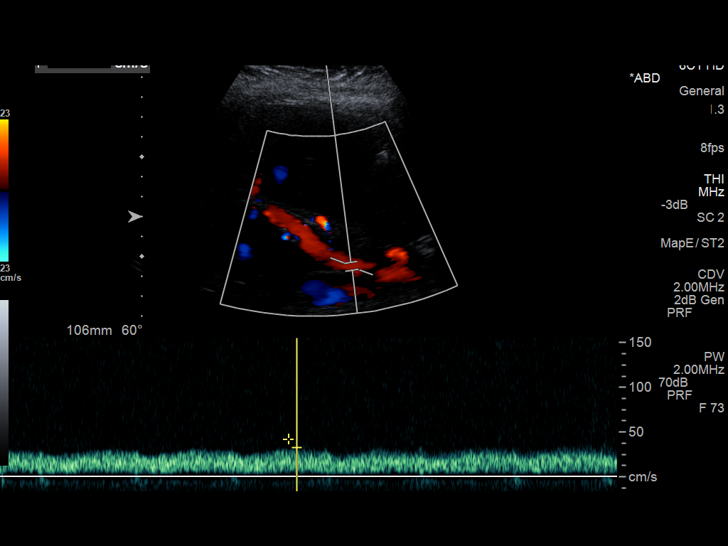
[im 28/67]
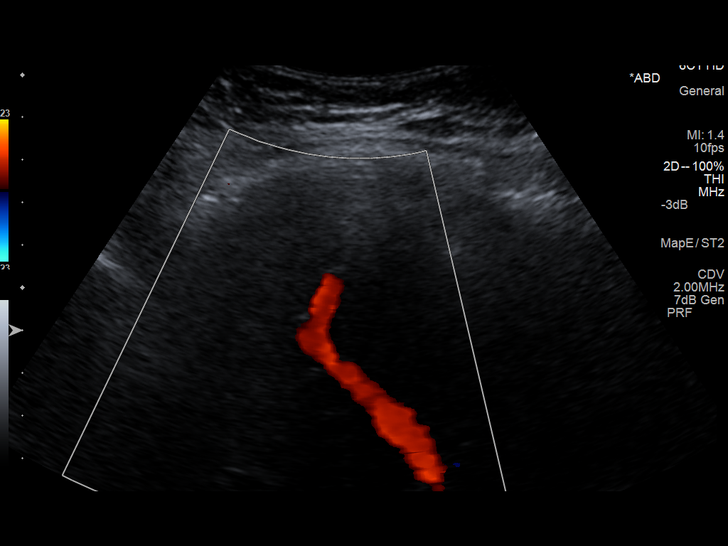
[im 34/67]
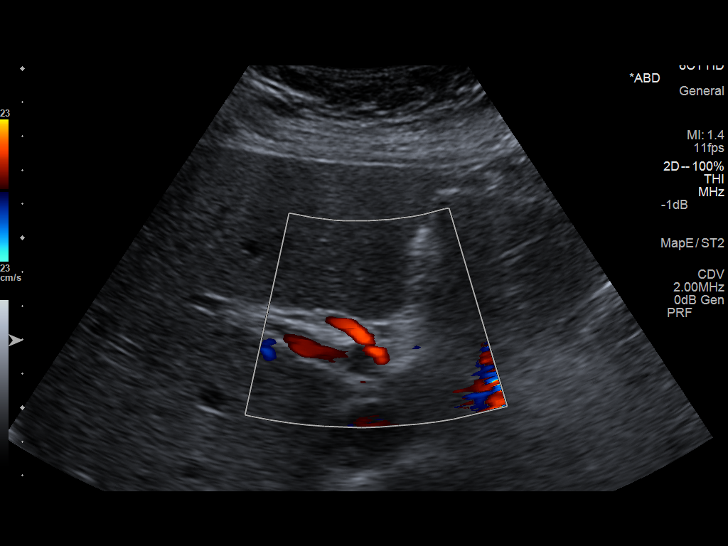
[im 39/67]
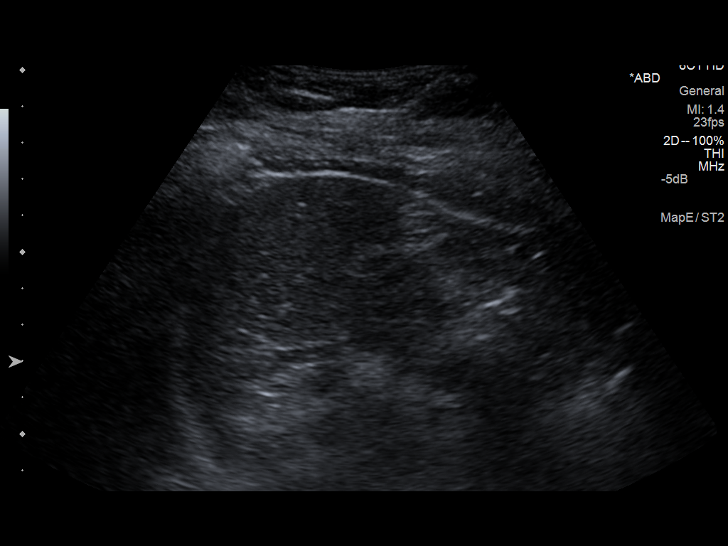
[im 45/67]
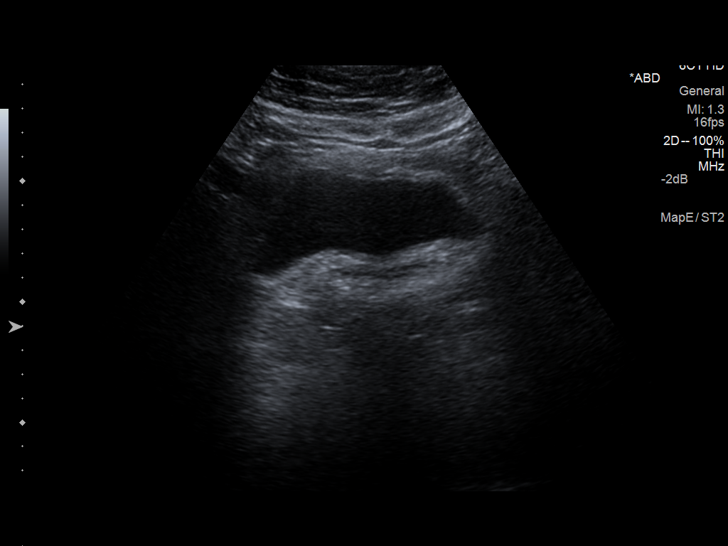
[im 50/67]
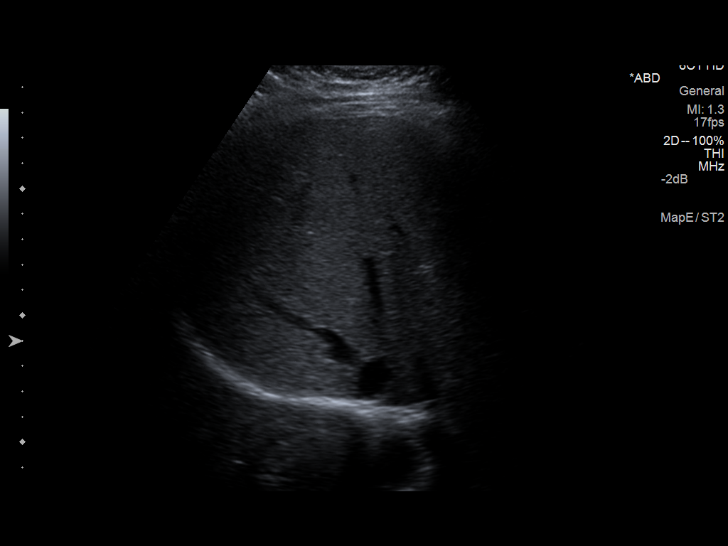
[im 56/67]
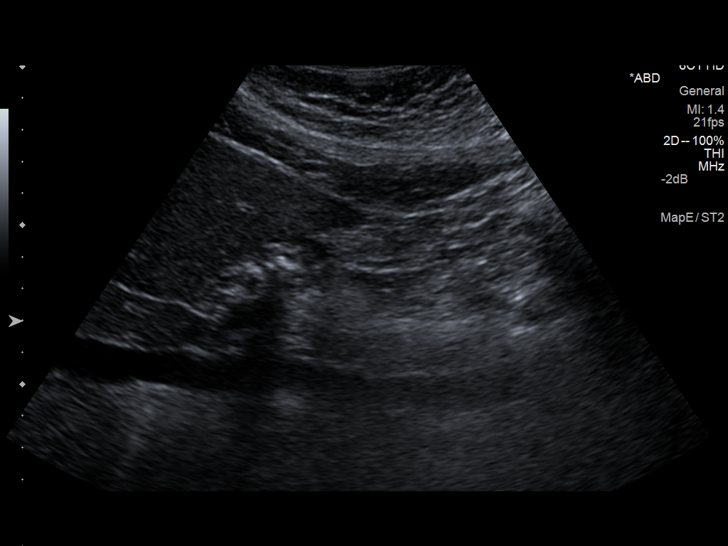
[im 61/67]
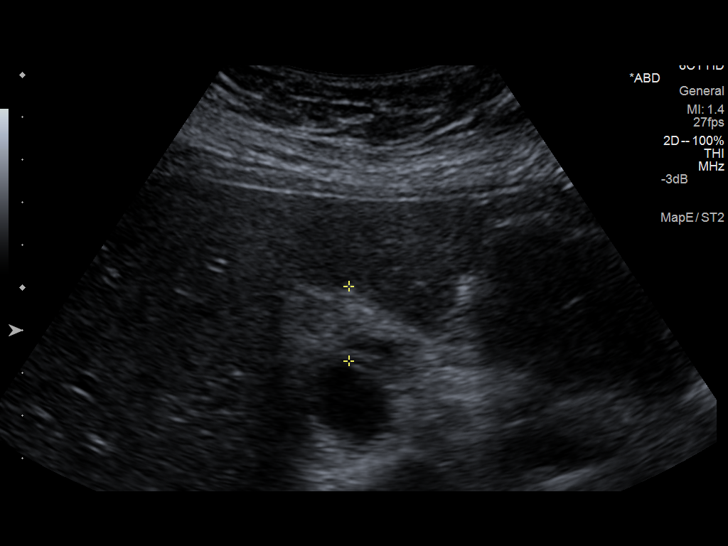
[im 67/67]
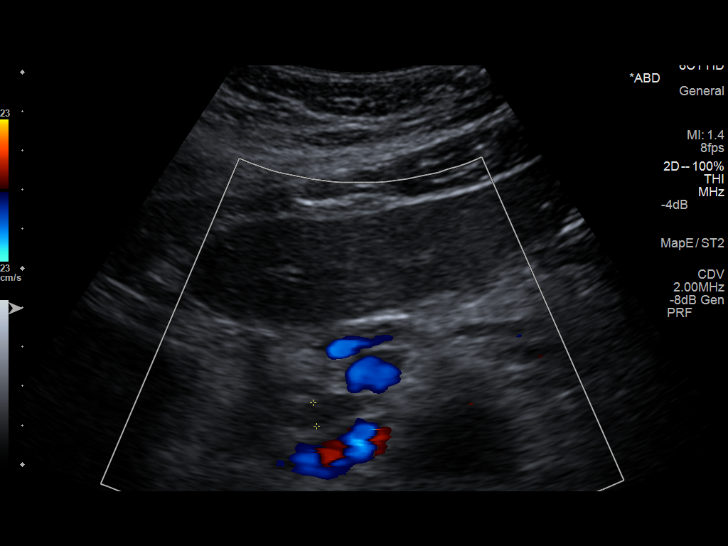

[13 of 25 positions shown; findings below may reference images not displayed]

FINDINGS: Portal Vein Velocities

Main:  21.5 cm/sec

Right:  22.3 cm/sec

Left:  10.7 cm/sec

Hepatic Vein Velocities

Right:  38 cm/sec

Middle:  21.4 cm/sec

Left:  25.7 cm/sec

Hepatic Artery Velocity:  66.1 cm/sec

Splenic Vein Velocity:  18.7 cm/sec

Varices: Absent

Ascites: Absent

Normal appearance of the liver and no evidence for biliary
dilatation. There is normal hepatofugal flow in the hepatic veins.
Normal respiratory phasicity in the hepatic veins. No evidence for
Budd-Chiari syndrome. Normal hepatopetal flow in the portal veins.
Inferior vena cava is patent. Normal appearance of the spleen. No
evidence for ascites. There is fluid in the urinary bladder. There
are echogenic structures with posterior acoustic shadowing in the
gallbladder and most compatible with gallstones. Gallbladder wall
appears to be thickened, measuring up to 1.8 cm. In addition, the
patient is reportedly tender over the gallbladder. The distal common
bile duct measures 0.6 cm.
IMPRESSION: Cholelithiasis and marked gallbladder wall thickening. Findings are
suggestive for acute cholecystitis.

Normal liver Doppler. Normal flow within the portal veins and
hepatic veins.

## 2014-10-06 IMAGING — RF DG CHOLANGIOGRAM OPERATIVE
1 series · 4 of 4 positions shown · non-contrast
Comparison: Abdominal ultrasound 03/17/2013.

CLINICAL DATA: Cholelithiasis and acute cholecystitis. Laparoscopic
cholecystectomy.

EXAM:
INTRAOPERATIVE CHOLANGIOGRAM
TECHNIQUE: Cholangiographic images from the C-arm fluoroscopic device were
submitted for interpretation post-operatively. Please see the
procedural report for the amount of contrast and the fluoroscopy
time utilized.

[Series 1: run · 4 of 71 frames shown]
[frame 11/71]
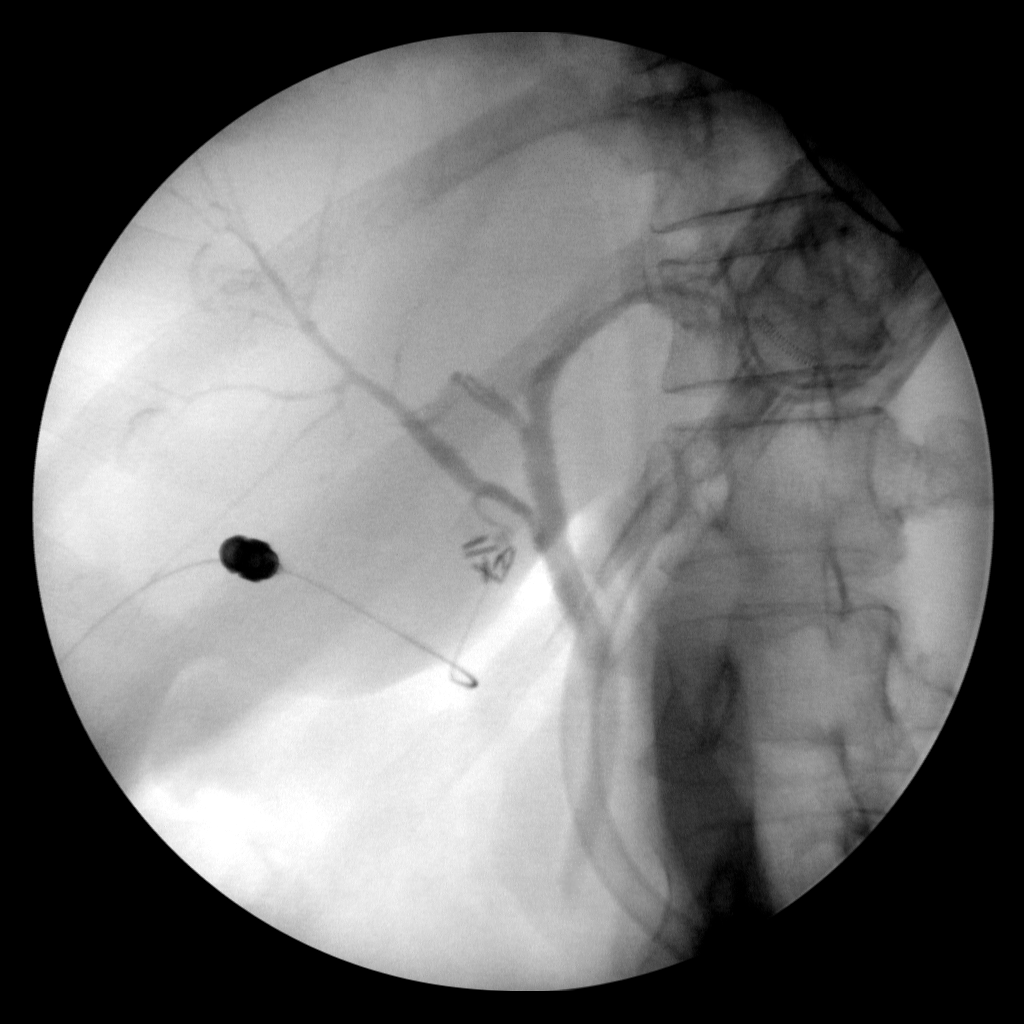
[frame 36/71]
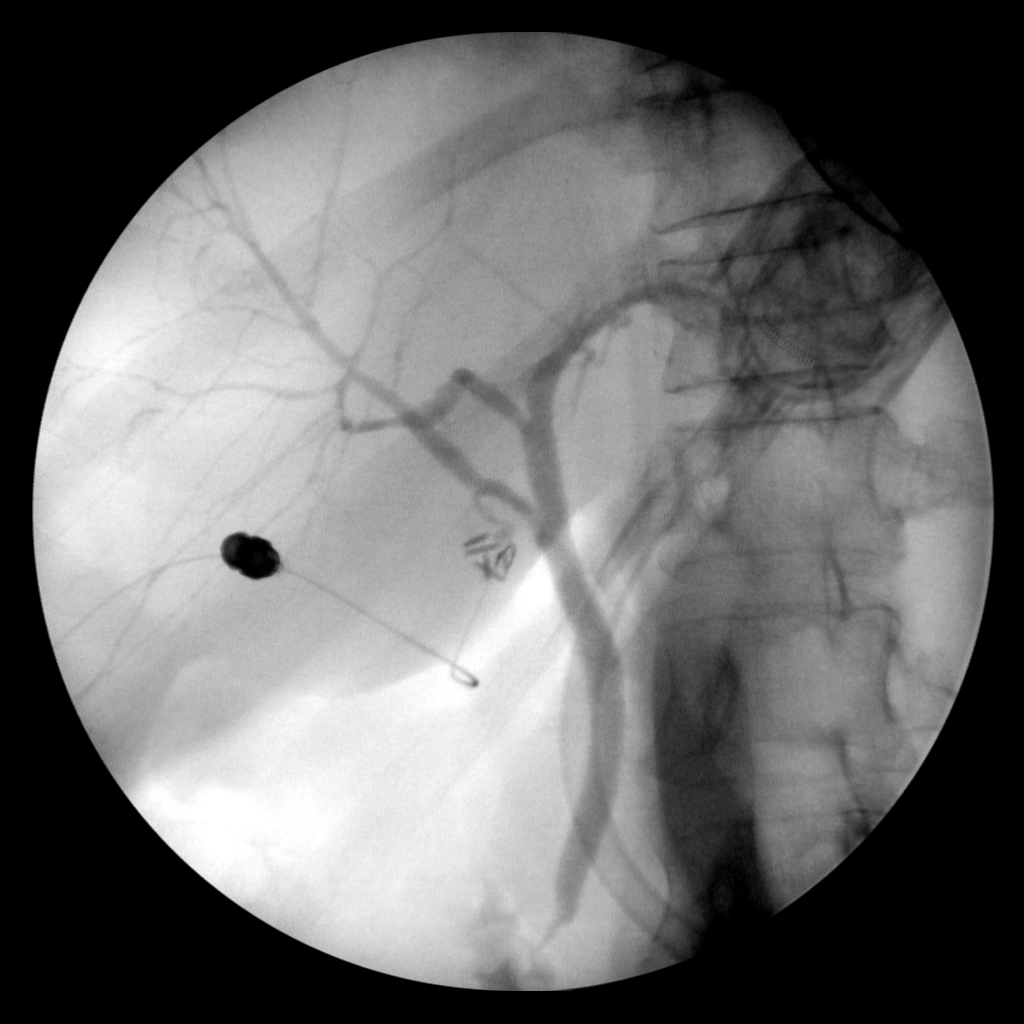
[frame 61/71]
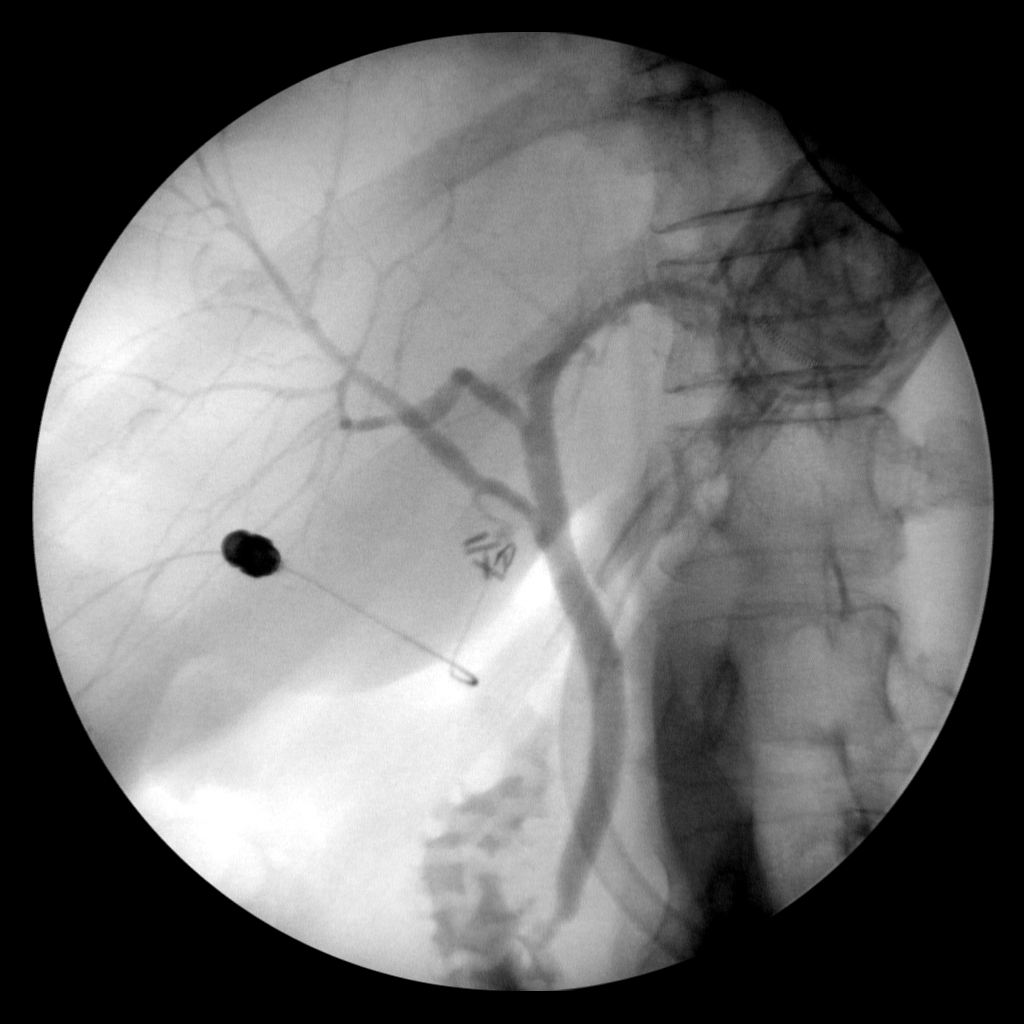
[frame 71/71]
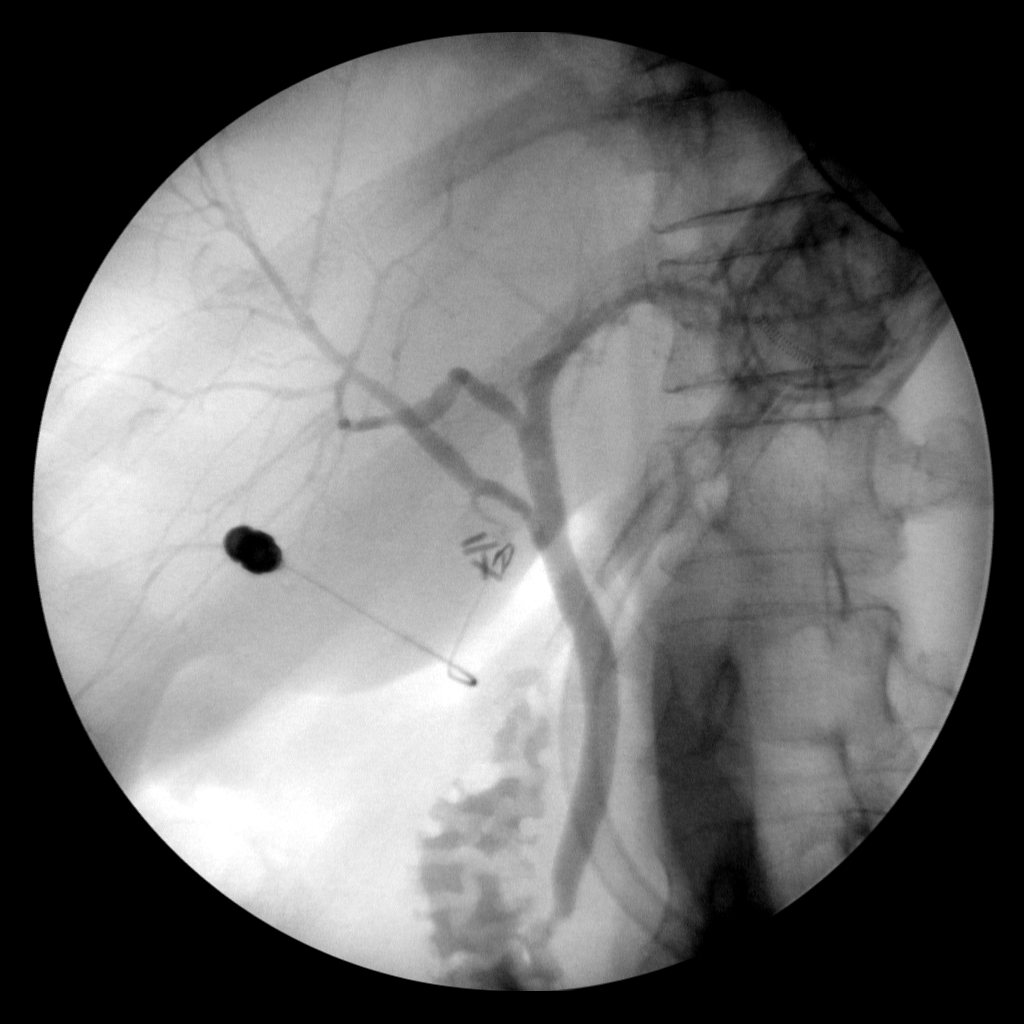

[4 of 4 positions shown; findings below may reference images not displayed]

FINDINGS: The cannula is present in the cystic duct remnant. There is
excellent opacification of the common bile duct, common hepatic
duct, and central intrahepatic ducts. No filling defects to suggest
retained stones. Excellent antegrade flow into the duodenum. There
is apparent ectopic insertion of the cystic duct into an accessory
right intrahepatic duct, and this duct joins the common hepatic duct
at the hilum or just outside the liver edge.

The radiologic technologist documented 12 seconds of fluoroscopy
time.
IMPRESSION: 1. No evidence of retained bile duct stones or biliary obstruction.
2. Apparent ectopic insertion of the cystic duct into an accessory
right intrahepatic duct.

## 2015-04-14 ENCOUNTER — Other Ambulatory Visit: Payer: Self-pay | Admitting: Internal Medicine

## 2015-04-14 DIAGNOSIS — Z1231 Encounter for screening mammogram for malignant neoplasm of breast: Secondary | ICD-10-CM

## 2015-05-02 ENCOUNTER — Ambulatory Visit: Payer: Self-pay

## 2018-01-22 DIAGNOSIS — S82841A Displaced bimalleolar fracture of right lower leg, initial encounter for closed fracture: Secondary | ICD-10-CM | POA: Insufficient documentation

## 2018-01-27 ENCOUNTER — Non-Acute Institutional Stay (SKILLED_NURSING_FACILITY): Payer: BLUE CROSS/BLUE SHIELD | Admitting: Internal Medicine

## 2018-01-27 ENCOUNTER — Encounter: Payer: Self-pay | Admitting: Internal Medicine

## 2018-01-27 DIAGNOSIS — D508 Other iron deficiency anemias: Secondary | ICD-10-CM | POA: Diagnosis not present

## 2018-01-27 DIAGNOSIS — S82841A Displaced bimalleolar fracture of right lower leg, initial encounter for closed fracture: Secondary | ICD-10-CM

## 2018-01-27 DIAGNOSIS — E559 Vitamin D deficiency, unspecified: Secondary | ICD-10-CM

## 2018-01-27 DIAGNOSIS — S82241D Displaced spiral fracture of shaft of right tibia, subsequent encounter for closed fracture with routine healing: Secondary | ICD-10-CM

## 2018-01-27 DIAGNOSIS — E538 Deficiency of other specified B group vitamins: Secondary | ICD-10-CM

## 2018-01-27 DIAGNOSIS — M5441 Lumbago with sciatica, right side: Secondary | ICD-10-CM

## 2018-01-27 DIAGNOSIS — F4321 Adjustment disorder with depressed mood: Secondary | ICD-10-CM

## 2018-01-27 DIAGNOSIS — G8929 Other chronic pain: Secondary | ICD-10-CM

## 2018-01-27 NOTE — Progress Notes (Signed)
:   Location:  [facility] Room Number: 101P Place of Service:  SNF (31)  Ayala Ribble D. Lyn Hollingshead, MD  Patient Care Team: Cloward, Laban Emperor, MD as PCP - General (Internal Medicine)  Extended Emergency Contact Information Primary Emergency Contact: Diaz,Deidad Address: [address]          [place], [place] [address] [place] Home Phone: [phone] Relation: Sister     Allergies: Patient has no known allergies.  Chief Complaint  Patient presents with  . New Admit To SNF    Admit to [facility]    HPI: Patient is 56 y.o. female vitamin D deficiency, anemia, neuropathy, who presented to [facility] emergency department after a fall while she was mowing her lawn on a hill.  She sustained a twisting injury to her righ as well.  Patient is admitted to SNF for OT/PT.  While at skilled nursing facility patient will be followed for neuropathy treated with Neurontin, depression treated with Zoloft and vitamin D deficiency treated with replacement.  T leg and x-rays in the emergency department revealed a displaced spiral right tibial shaft fracture with associated bimalleolar fracture.  Patient was admitted to [facility] from 8/14-20 where she underwent surgery on 01/21/2018.  Patient was started on aspirin 81 mg twice daily for 6 weeks and she was started on calcium.  Patient is admitted to skilled nursing facility for OT/PT.  While at skilled nursing facility patient will be followed for vitamin D deficiency treated with vitamin and D, neuropathy treated with Neurontin and iron deficiency anemia treated with iron.  Past Medical History:  Diagnosis Date  . Adjustment reaction with prolonged depressive reaction   . Anal condyloma   . Cholecystolithiasis 03/19/2013  . Chronic cholecystitis   . DDD (degenerative disc disease), lumbosacral   . DJD (degenerative joint disease), lumbosacral   . GERD (gastroesophageal reflux  disease)   . History of migraine headaches   . Rotoscoliosis   . Seasonal allergies   . Steatohepatitis   . Vitamin B12 deficiency     Past Surgical History:  Procedure Laterality Date  . BUNIONECTOMY    . CHOLECYSTECTOMY N/A 03/21/2013   Procedure: LAPAROSCOPIC CHOLECYSTECTOMY WITH INTRAOPERATIVE CHOLANGIOGRAM;  Surgeon: Kandis Cocking, MD;  Location: [facility];  Service: General;  Laterality: N/A;    Allergies as of 01/27/2018   No Known Allergies     Medication List        Accurate as of 01/27/18 11:17 AM. Always use your most recent med list.          acetaminophen 500 MG tablet Commonly known as:  TYLENOL Take 1,000 mg by mouth every 6 (six) hours as needed.   aspirin EC 81 MG tablet Take 81 mg by mouth 2 (two) times daily.   calcium carbonate 1250 (500 Ca) MG tablet Commonly known as:  OS-CAL - dosed in mg of elemental calcium Take 1 tablet by mouth.   Cholecalciferol 2000 units Caps Take 2,000 Units by mouth daily.   dexlansoprazole 60 MG capsule Commonly known as:  DEXILANT Take 60 mg by mouth daily as needed.   ferrous sulfate 325 (65 FE) MG tablet Take 325 mg by mouth. Take 325 mg by mouth 3 (three) times a week. On Monday, Wednesday and Friday.   gabapentin 300 MG capsule Commonly known as:  NEURONTIN Take 300 mg by mouth 3 (three) times daily.   GLUCOSAMINE HCL PO  Take 1 tablet by mouth daily.   OMEGA-3 FATTY ACIDS PO Take 1,000 capsules by mouth. Twice a week on Monday and Friday.   oxyCODONE 5 MG immediate release tablet Commonly known as:  Oxy IR/ROXICODONE Take 5 mg by mouth every 4 (four) hours as needed for severe pain.   senna-docusate 8.6-50 MG tablet Commonly known as:  Senokot-S Take 2 tablets by mouth 2 (two) times daily.   sertraline 50 MG tablet Commonly known as:  ZOLOFT Take 50 mg by mouth every other day.   vitamin B-12 1000 MCG tablet Commonly known as:  CYANOCOBALAMIN Take 1,000 mcg by mouth daily.   Vitamin D  (Ergocalciferol) 50000 units Caps capsule Commonly known as:  DRISDOL Take 50,000 Units by mouth every 7 (seven) days.       No orders of the defined types were placed in this encounter.   There is no immunization history for the selected administration types on file for this patient.  Social History   Tobacco Use  . Smoking status: Never Smoker  . Smokeless tobacco: Never Used  Substance Use Topics  . Alcohol use: No    Family history is   Family History  Problem Relation Age of Onset  . Pancreatic cancer Mother   . Heart disease Father   . Diabetes Father       Review of Systems  DATA OBTAINED: from patient, nurse GENERAL:  no fevers, fatigue, appetite changes SKIN: No itching, or rash EYES: No eye pain, redness, discharge EARS: No earache, tinnitus, change in hearing NOSE: No congestion, drainage or bleeding  MOUTH/THROAT: No mouth or tooth pain, No sore throat RESPIRATORY: No cough, wheezing, SOB CARDIAC: No chest pain, palpitations, lower extremity edema  GI: No abdominal pain, No N/V/D or constipation, No heartburn or reflux  GU: No dysuria, frequency or urgency, or incontinence  MUSCULOSKELETAL: No unrelieved bone/joint pain NEUROLOGIC: No headache, dizziness or focal weakness PSYCHIATRIC: No c/o anxiety or sadness   Vitals:   01/27/18 0956  BP: 106/68  Pulse: 77  Resp: 20  Temp: 98.6 F (37 C)    SpO2 Readings from Last 1 Encounters:  03/22/13 94%   Body mass index is 28.02 kg/m.     Physical Exam  GENERAL APPEARANCE: Alert, conversant,  No acute distress.  SKIN: No diaphoresis rash HEAD: Normocephalic, atraumatic  EYES: Conjunctiva/lids clear. Pupils round, reactive. EOMs intact.  EARS: External exam WNL, canals clear. Hearing grossly normal.  NOSE: No deformity or discharge.  MOUTH/THROAT: Lips w/o lesions  RESPIRATORY: Breathing is even, unlabored. Lung sounds are clear   CARDIOVASCULAR: Heart RRR no murmurs, rubs or gallops. No  peripheral edema.   GASTROINTESTINAL: Abdomen is soft, non-tender, not distended w/ normal bowel sounds. GENITOURINARY: Bladder non tender, not distended  MUSCULOSKELETAL: No abnormal joints or musculature NEUROLOGIC:  Cranial nerves 2-12 grossly intact. Moves all extremities  PSYCHIATRIC: Mood and affect appropriate to situation, no behavioral issues  Patient Active Problem List   Diagnosis Date Noted  . Chronic cholecystitis 03/22/2013  . Cholecystolithiasis 03/19/2013  . Transaminitis possible cholecystitis 03/19/2013  . Steatohepatitis 03/19/2013  . GERD (gastroesophageal reflux disease)   . 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 BREAST PAIN 04/08/2010  . CHEST PAIN 04/08/2010      Labs reviewed: Basic Metabolic Panel:    Component Value Date/Time   NA 137 03/22/2013 0509   K 4.1 03/22/2013 0509   CL 102 03/22/2013 0509   CO2 29 03/22/2013 0509   GLUCOSE 99 03/22/2013 0509  BUN 5 (L) 03/22/2013 0509   CREATININE 0.60 03/22/2013 0509   CALCIUM 9.1 03/22/2013 0509   PROT 6.1 03/22/2013 0509   ALBUMIN 2.8 (L) 03/22/2013 0509   AST 58 (H) 03/22/2013 0509   ALT 189 (H) 03/22/2013 0509   ALKPHOS 126 (H) 03/22/2013 0509   BILITOT 1.2 03/22/2013 0509   GFRNONAA >90 03/22/2013 0509   GFRAA >90 03/22/2013 0509    No results for input(s): NA, K, CL, CO2, GLUCOSE, BUN, CREATININE, CALCIUM, MG, PHOS in the last 8760 hours. Liver Function Tests: No results for input(s): AST, ALT, ALKPHOS, BILITOT, PROT, ALBUMIN in the last 8760 hours. No results for input(s): LIPASE, AMYLASE in the last 8760 hours. No results for input(s): AMMONIA in the last 8760 hours. CBC: No results for input(s): WBC, NEUTROABS, HGB, HCT, MCV, PLT in the last 8760 hours. Lipid No results for input(s): CHOL, HDL, LDLCALC, TRIG in the last 8760 hours.  Cardiac Enzymes: No results for input(s): CKTOTAL, CKMB, CKMBINDEX, TROPONINI in the last 8760 hours. BNP: No results for input(s): BNP in the last 8760  hours. No results found for: MICROALBUR No results found for: HGBA1C No results found for: TSH No results found for: VITAMINB12 No results found for: FOLATE No results found for: IRON, TIBC, FERRITIN  Imaging and Procedures obtained prior to SNF admission: Mr Abd W/wo Cm/mrcp  Result Date: 05/25/2013 CLINICAL DATA:  Abdominal pain following cholecystectomy. EXAM: MRI ABDOMEN WITHOUT AND WITH CONTRAST (INCLUDING MRCP) TECHNIQUE: Multiplanar multisequence MR imaging of the abdomen was performed both before and after the administration of intravenous contrast. Heavily T2-weighted images of the biliary and pancreatic ducts were obtained, and three-dimensional MRCP images were rendered by post processing. CONTRAST:  14mL MULTIHANCE GADOBENATE DIMEGLUMINE 529 MG/ML IV SOLN COMPARISON:  Intraoperative cholangiogram 03/21/2013 FINDINGS: No intrahepatic or extrahepatic biliary duct dilatation. The common hepatic duct and common bile duct are normal caliber. No filling defect within the common bile duct. Patient status post cholecystectomy. No abnormal fluid collection within the gallbladder fossa. No focal hepatic lesion. The pancreatic duct and pancreatic parenchyma are normal without evidence of variant ductal anatomy or inflammation. To spleen, adrenal glands, and kidneys are normal. The stomach and limited view of the small bowel and colon are unremarkable. No abdominal lymphadenopathy. No pericardial fluid or pleural fluid. Hemangioma in the upper lumbar spine. IMPRESSION: 1. No complication following cholecystectomy. 2. Normal biliary tree with no filling defects within the common bile duct. 3. Normal pancreas and pancreatic ductal anatomy. Electronically Signed   By: Genevive Bi M.D.   On: 05/25/2013 13:35     Not all labs, radiology exams or other studies done during hospitalization come through on my EPIC note; however they are reviewed by me.    Assessment and Plan  Displaced spiral  fracture of tibia/bimalleolar ankle fracture- patient underwent IM nailing of the tibia and ORIF of the ankle on 8/15; there were no complications.  Patient was started on ASA 81 mg twice daily for 6 weeks for DVT prophylaxis and on calcium, patient was already on vitamin D SNF -admitted for OT/PT.  Continue aspirin 81 mg for 6 weeks for DVT prophylaxis, continue calcium carbonate 500 mg of calcium 1 tablet daily along with her regular vitamin D supplementation  Vitamin D deficiency SNF -continue 50,000 units weekly and 2000 units daily  Iron deficiency anemia SNF -apparently no discharge hemoglobin was taken, admission hemoglobin was 11.7; follow-up CBC; continue iron 325 3 times a week  Sciatica, right side  SNF -continue Neurontin 300 mg 3 times daily  Depression SNF -continue Zoloft 25 mg QOD  B12 deficiency SNF -continue replacement 1000 mcg daily   Time spent greater than 45 minutes;> 50% of time with patient was spent reviewing records, labs, tests and studies, counseling and developing plan of care  Thurston Holenne D. Lyn HollingsheadAlexander, MD

## 2018-01-31 ENCOUNTER — Encounter: Payer: Self-pay | Admitting: Internal Medicine

## 2018-01-31 DIAGNOSIS — E559 Vitamin D deficiency, unspecified: Secondary | ICD-10-CM | POA: Insufficient documentation

## 2018-01-31 DIAGNOSIS — S82241A Displaced spiral fracture of shaft of right tibia, initial encounter for closed fracture: Secondary | ICD-10-CM | POA: Insufficient documentation

## 2018-01-31 DIAGNOSIS — D509 Iron deficiency anemia, unspecified: Secondary | ICD-10-CM | POA: Insufficient documentation

## 2018-01-31 DIAGNOSIS — F4321 Adjustment disorder with depressed mood: Secondary | ICD-10-CM | POA: Insufficient documentation

## 2018-01-31 DIAGNOSIS — E538 Deficiency of other specified B group vitamins: Secondary | ICD-10-CM | POA: Insufficient documentation

## 2018-01-31 DIAGNOSIS — M5440 Lumbago with sciatica, unspecified side: Secondary | ICD-10-CM | POA: Insufficient documentation

## 2018-02-01 LAB — CBC AND DIFFERENTIAL
HCT: 33 — AB (ref 36–46)
Hemoglobin: 11.2 — AB (ref 12.0–16.0)
Platelets: 455 — AB (ref 150–399)
WBC: 7.9

## 2018-02-01 LAB — BASIC METABOLIC PANEL
BUN: 10 (ref 4–21)
CREATININE: 0.4 — AB (ref 0.5–1.1)
GLUCOSE: 95
POTASSIUM: 5.2 (ref 3.4–5.3)
SODIUM: 141 (ref 137–147)

## 2018-02-10 ENCOUNTER — Non-Acute Institutional Stay (SKILLED_NURSING_FACILITY): Payer: BLUE CROSS/BLUE SHIELD | Admitting: Internal Medicine

## 2018-02-10 ENCOUNTER — Encounter: Payer: Self-pay | Admitting: Internal Medicine

## 2018-02-10 DIAGNOSIS — E559 Vitamin D deficiency, unspecified: Secondary | ICD-10-CM

## 2018-02-10 DIAGNOSIS — S82241D Displaced spiral fracture of shaft of right tibia, subsequent encounter for closed fracture with routine healing: Secondary | ICD-10-CM | POA: Diagnosis not present

## 2018-02-10 DIAGNOSIS — S82841A Displaced bimalleolar fracture of right lower leg, initial encounter for closed fracture: Secondary | ICD-10-CM

## 2018-02-10 DIAGNOSIS — E538 Deficiency of other specified B group vitamins: Secondary | ICD-10-CM

## 2018-02-10 DIAGNOSIS — F4321 Adjustment disorder with depressed mood: Secondary | ICD-10-CM

## 2018-02-10 DIAGNOSIS — D508 Other iron deficiency anemias: Secondary | ICD-10-CM | POA: Diagnosis not present

## 2018-02-10 DIAGNOSIS — G8929 Other chronic pain: Secondary | ICD-10-CM

## 2018-02-10 DIAGNOSIS — M5441 Lumbago with sciatica, right side: Secondary | ICD-10-CM

## 2018-02-10 NOTE — Progress Notes (Signed)
Location:  [facility] Room Number: 101P Place of Service:  SNF (31)  Randon Goldsmith. Lyn Hollingshead, MD  Patient Care Team: Cloward, Laban Emperor, MD as PCP - General (Internal Medicine)  Extended Emergency Contact Information Primary Emergency Contact: Arnecia, Ector Home Phone: [phone] Relation: Daughter Secondary Emergency Contact: Bobbye Morton Home Phone: [phone] Relation: Relative  No Known Allergies  Chief Complaint  Patient presents with  . Discharge Note    Discharge from [facility]    HPI:  57 y.o. female with vitamin D deficiency, anemia, neuropathy, who presented to [facility] emergency department after a fall while she was mowing her lawn on a hill.  She sustained a twisting injury to her right leg.  Leg x-rays in the emergency department revealed a displaced spiral right tibial shaft fracture associated with a bimalleolar fracture.  Patient was admitted to [facility] from 8/14-20 where she underwent surgery on 01/21/2018.  Patient was started on aspirin 81 mg twice daily for 6 weeks as prophylaxis and she was started on calcium.  Patient was admitted to skilled nursing facility for OT/PT and is now ready to be discharged to home.    Past Medical History:  Diagnosis Date  . Adjustment reaction with prolonged depressive reaction   . Anal condyloma   . Cholecystolithiasis 03/19/2013  . Chronic cholecystitis   . DDD (degenerative disc disease), lumbosacral   . DJD (degenerative joint disease), lumbosacral   . GERD (gastroesophageal reflux disease)   . History of migraine headaches   . Rotoscoliosis   . Seasonal allergies   . Steatohepatitis   . Vitamin B12 deficiency     Past Surgical History:  Procedure Laterality Date  . BUNIONECTOMY    . CHOLECYSTECTOMY N/A 03/21/2013   Procedure: LAPAROSCOPIC CHOLECYSTECTOMY WITH INTRAOPERATIVE CHOLANGIOGRAM;  Surgeon: Kandis Cocking, MD;  Location: [facility];   Service: General;  Laterality: N/A;     reports that she has never smoked. She has never used smokeless tobacco. She reports that she does not drink alcohol or use drugs. Social History   Socioeconomic History  . Marital status: Married    Spouse name: Not on file  . Number of children: Not on file  . Years of education: Not on file  . Highest education level: Not on file  Occupational History  . Not on file  Social Needs  . Financial resource strain: Not on file  . Food insecurity:    Worry: Not on file    Inability: Not on file  . Transportation needs:    Medical: Not on file    Non-medical: Not on file  Tobacco Use  . Smoking status: Never Smoker  . Smokeless tobacco: Never Used  Substance and Sexual Activity  . Alcohol use: No  . Drug use: No  . Sexual activity: Not on file  Lifestyle  . Physical activity:    Days per week: Not on file    Minutes per session: Not on file  . Stress: Not on file  Relationships  . Social connections:    Talks on phone: Not on file    Gets together: Not on file    Attends religious service: Not on file    Active member of club or organization: Not on file    Attends meetings of clubs or organizations: Not on file    Relationship status: Not on file  . Intimate partner violence:    Fear of current  or ex partner: Not on file    Emotionally abused: Not on file    Physically abused: Not on file    Forced sexual activity: Not on file  Other Topics Concern  . Not on file  Social History Narrative  . Not on file    Pertinent  Health Maintenance Due  Topic Date Due  . PAP SMEAR  10/27/1979  . COLONOSCOPY  10/26/2008  . MAMMOGRAM  11/10/2014  . INFLUENZA VACCINE  01/07/2018    Medications: Allergies as of 02/10/2018   No Known Allergies     Medication List        Accurate as of 02/10/18 11:59 PM. Always use your most recent med list.          acetaminophen 325 MG tablet Commonly known as:  TYLENOL Take 650 mg by mouth  every 6 (six) hours as needed.   calcium carbonate 1250 (500 Ca) MG tablet Commonly known as:  OS-CAL - dosed in mg of elemental calcium Take 1 tablet by mouth.   dexlansoprazole 60 MG capsule Commonly known as:  DEXILANT Take 60 mg by mouth daily as needed.   ferrous sulfate 325 (65 FE) MG tablet Take 325 mg by mouth. Take 325 mg by mouth 3 (three) times a week. On Monday, Wednesday and Friday.   gabapentin 300 MG capsule Commonly known as:  NEURONTIN Take 300 mg by mouth 3 (three) times daily.   GLUCOSAMINE HCL PO Take 1 tablet by mouth daily.   OMEGA-3 FATTY ACIDS PO Take 1,000 capsules by mouth. Twice a week on Monday and Friday.   senna-docusate 8.6-50 MG tablet Commonly known as:  Senokot-S Take 2 tablets by mouth 2 (two) times daily.   sertraline 50 MG tablet Commonly known as:  ZOLOFT Take 50 mg by mouth every other day.   vitamin B-12 1000 MCG tablet Commonly known as:  CYANOCOBALAMIN Take 1,000 mcg by mouth daily.   Vitamin D (Ergocalciferol) 50000 units Caps capsule Commonly known as:  DRISDOL Take 50,000 Units by mouth every 7 (seven) days.        Vitals:   02/10/18 1005  BP: (!) 90/54  Pulse: 64  Resp: 18  Temp: 98.4 F (36.9 C)  Weight: 153 lb (69.4 kg)  Height: 5\' 2"  (1.575 m)   Body mass index is 27.98 kg/m.  Physical Exam  GENERAL APPEARANCE: Alert, conversant. No acute distress.  HEENT: Unremarkable. RESPIRATORY: Breathing is even, unlabored. Lung sounds are clear   CARDIOVASCULAR: Heart RRR no murmurs, rubs or gallops. No peripheral edema.  GASTROINTESTINAL: Abdomen is soft, non-tender, not distended w/ normal bowel sounds.  NEUROLOGIC: Cranial nerves 2-12 grossly intact. Moves all extremities   Labs reviewed: Basic Metabolic Panel: Recent Labs    02/01/18  NA 141  K 5.2  BUN 10  CREATININE 0.4*   No results found for: [facility] Liver Function Tests: No results for input(s): AST, ALT, ALKPHOS, BILITOT, PROT, ALBUMIN in  the last 8760 hours. No results for input(s): LIPASE, AMYLASE in the last 8760 hours. No results for input(s): AMMONIA in the last 8760 hours. CBC: Recent Labs    02/01/18  WBC 7.9  HGB 11.2*  HCT 33*  PLT 455*   Lipid No results for input(s): CHOL, HDL, LDLCALC, TRIG in the last 8760 hours. Cardiac Enzymes: No results for input(s): CKTOTAL, CKMB, CKMBINDEX, TROPONINI in the last 8760 hours. BNP: No results for input(s): BNP in the last 8760 hours. CBG: No results for input(s): GLUCAP  in the last 8760 hours.  Procedures and Imaging Studies During Stay: No results found.  Assessment/Plan:   Bimalleolar ankle fracture, right, closed, initial encounter  Closed displaced spiral fracture of shaft of right tibia with routine healing, subsequent encounter  Iron deficiency anemia secondary to inadequate dietary iron intake  Vitamin D deficiency  Chronic midline low back pain with right-sided sciatica  Adjustment reaction with prolonged depressive reaction  Vitamin B12 deficiency   Patient is being discharged with the following home health services: OT/PT/Aide   Patient is being discharged with the following durable medical equipment:  WC,rolling walker,3 in 1 bedside commode  Patient has been advised to f/u with their PCP in 1-2 weeks to bring them up to date on their rehab stay.  Social services at facility was responsible for arranging this appointment.  Pt was provided with a 30 day supply of prescriptions for medications and refills must be obtained from their PCP.  For controlled substances, a more limited supply may be provided adequate until PCP appointment only.  Medications have been reconciled.  Time spent greater than 30 minutes;> 50% of time with patient was spent reviewing records, labs, tests and studies, counseling and developing plan of care  Randon Goldsmith. Lyn Hollingshead, MD

## 2018-02-13 ENCOUNTER — Encounter: Payer: Self-pay | Admitting: Internal Medicine

## 2018-03-23 ENCOUNTER — Other Ambulatory Visit: Payer: Self-pay | Admitting: Internal Medicine

## 2021-01-23 ENCOUNTER — Telehealth: Payer: Self-pay | Admitting: Gastroenterology

## 2021-01-23 NOTE — Telephone Encounter (Signed)
Patient evaluated with evidence of a navel and peri-umbilical erythema/cellulitic appearance.  Patient reports prior history of keloids in the area after gallbladder resection years prior.  Has had some progressive warmth and tenderness to the area.  No alteration of bowel habits or nausea or vomiting or appetite.  No dysuria. Nodular area within the umbilicus is warm to touch on glove assessment no overt fluctuance is noted.  Secondary area in the inferior portion of the umbilicus is a typical keloid that is unremarkable.  Abdomen area outlined in marker with concern for potential cellulitic area, to be monitored over the course of coming days. Plan will be for a course of antibiotics with concern for potential cellulitis. Bactrim 1-2 tablets double strength twice daily for 10 days prescribed to patient's pharmacy in [facility] Patient to follow-up with PCP if issues persist or worsen.  Corliss Parish, MD [facility] Office # [phone]
